# Patient Record
Sex: Female | Born: 1935 | Race: White | Hispanic: No | Smoking: Never smoker
Health system: Southern US, Community
[De-identification: ages and names within clinical notes are randomized; demographics above are authoritative.]

## PROBLEM LIST (undated history)

## (undated) DIAGNOSIS — K311 Adult hypertrophic pyloric stenosis: Secondary | ICD-10-CM

## (undated) DIAGNOSIS — R1310 Dysphagia, unspecified: Secondary | ICD-10-CM

## (undated) DIAGNOSIS — I4891 Unspecified atrial fibrillation: Secondary | ICD-10-CM

## (undated) DIAGNOSIS — K623 Rectal prolapse: Secondary | ICD-10-CM

## (undated) DIAGNOSIS — I5030 Unspecified diastolic (congestive) heart failure: Secondary | ICD-10-CM

## (undated) DIAGNOSIS — R1319 Other dysphagia: Secondary | ICD-10-CM

## (undated) DIAGNOSIS — M1990 Unspecified osteoarthritis, unspecified site: Secondary | ICD-10-CM

## (undated) DIAGNOSIS — I10 Essential (primary) hypertension: Secondary | ICD-10-CM

## (undated) HISTORY — DX: Unspecified atrial fibrillation: I48.91

## (undated) HISTORY — PX: TOTAL HIP ARTHROPLASTY: SHX124

## (undated) HISTORY — PX: ARTHROSCOPIC REPAIR PCL: SUR81

## (undated) HISTORY — DX: Adult hypertrophic pyloric stenosis: K31.1

## (undated) HISTORY — DX: Essential (primary) hypertension: I10

## (undated) HISTORY — DX: Unspecified osteoarthritis, unspecified site: M19.90

## (undated) HISTORY — DX: Rectal prolapse: K62.3

## (undated) HISTORY — DX: Other dysphagia: R13.19

## (undated) HISTORY — DX: Unspecified diastolic (congestive) heart failure: I50.30

## (undated) HISTORY — DX: Dysphagia, unspecified: R13.10

---

## 1997-12-23 ENCOUNTER — Other Ambulatory Visit: Admission: RE | Admit: 1997-12-23 | Discharge: 1997-12-23 | Payer: Self-pay | Admitting: Obstetrics and Gynecology

## 1998-01-12 ENCOUNTER — Inpatient Hospital Stay (HOSPITAL_COMMUNITY): Admission: RE | Admit: 1998-01-12 | Discharge: 1998-01-14 | Payer: Self-pay | Admitting: Obstetrics and Gynecology

## 2000-02-09 ENCOUNTER — Other Ambulatory Visit: Admission: RE | Admit: 2000-02-09 | Discharge: 2000-02-09 | Payer: Self-pay | Admitting: Obstetrics and Gynecology

## 2000-09-24 ENCOUNTER — Encounter: Admission: RE | Admit: 2000-09-24 | Discharge: 2000-09-24 | Payer: Self-pay | Admitting: Internal Medicine

## 2000-09-24 ENCOUNTER — Encounter: Payer: Self-pay | Admitting: Internal Medicine

## 2009-05-22 HISTORY — PX: OTHER SURGICAL HISTORY: SHX169

## 2009-05-25 ENCOUNTER — Encounter (INDEPENDENT_AMBULATORY_CARE_PROVIDER_SITE_OTHER): Payer: Self-pay | Admitting: *Deleted

## 2009-06-07 ENCOUNTER — Encounter: Payer: Self-pay | Admitting: Internal Medicine

## 2009-06-07 ENCOUNTER — Encounter (INDEPENDENT_AMBULATORY_CARE_PROVIDER_SITE_OTHER): Payer: Self-pay | Admitting: *Deleted

## 2009-06-07 ENCOUNTER — Inpatient Hospital Stay (HOSPITAL_COMMUNITY): Admission: AD | Admit: 2009-06-07 | Discharge: 2009-06-13 | Payer: Self-pay | Admitting: Internal Medicine

## 2009-06-07 ENCOUNTER — Ambulatory Visit: Payer: Self-pay | Admitting: Cardiology

## 2009-06-07 ENCOUNTER — Ambulatory Visit: Payer: Self-pay | Admitting: Family

## 2009-06-07 DIAGNOSIS — I4891 Unspecified atrial fibrillation: Secondary | ICD-10-CM

## 2009-06-07 DIAGNOSIS — I509 Heart failure, unspecified: Secondary | ICD-10-CM | POA: Insufficient documentation

## 2009-06-07 DIAGNOSIS — K623 Rectal prolapse: Secondary | ICD-10-CM | POA: Insufficient documentation

## 2009-06-13 ENCOUNTER — Encounter (INDEPENDENT_AMBULATORY_CARE_PROVIDER_SITE_OTHER): Payer: Self-pay | Admitting: *Deleted

## 2009-06-15 ENCOUNTER — Encounter: Payer: Self-pay | Admitting: Gastroenterology

## 2009-06-16 ENCOUNTER — Ambulatory Visit: Payer: Self-pay | Admitting: Internal Medicine

## 2009-06-16 ENCOUNTER — Ambulatory Visit: Payer: Self-pay | Admitting: Cardiology

## 2009-06-16 ENCOUNTER — Telehealth (INDEPENDENT_AMBULATORY_CARE_PROVIDER_SITE_OTHER): Payer: Self-pay | Admitting: *Deleted

## 2009-06-17 LAB — CONVERTED CEMR LAB
BUN: 40 mg/dL — ABNORMAL HIGH (ref 6–23)
CO2: 29 meq/L (ref 19–32)
Calcium: 9.9 mg/dL (ref 8.4–10.5)
GFR calc non Af Amer: 49.93 mL/min (ref >60)
Glucose, Bld: 91 mg/dL (ref 70–99)
Potassium: 5.5 meq/L — ABNORMAL HIGH (ref 3.5–5.1)

## 2009-06-21 ENCOUNTER — Telehealth (INDEPENDENT_AMBULATORY_CARE_PROVIDER_SITE_OTHER): Payer: Self-pay | Admitting: *Deleted

## 2009-06-22 ENCOUNTER — Ambulatory Visit: Payer: Self-pay | Admitting: Internal Medicine

## 2009-06-22 ENCOUNTER — Ambulatory Visit: Payer: Self-pay | Admitting: Family

## 2009-06-22 ENCOUNTER — Telehealth (INDEPENDENT_AMBULATORY_CARE_PROVIDER_SITE_OTHER): Payer: Self-pay | Admitting: *Deleted

## 2009-06-22 LAB — CONVERTED CEMR LAB: POC INR: 3.1

## 2009-06-23 ENCOUNTER — Encounter (INDEPENDENT_AMBULATORY_CARE_PROVIDER_SITE_OTHER): Payer: Self-pay | Admitting: *Deleted

## 2009-06-23 LAB — CONVERTED CEMR LAB
BUN: 47 mg/dL — ABNORMAL HIGH (ref 6–23)
Glucose, Bld: 98 mg/dL (ref 70–99)
Potassium: 4.4 meq/L (ref 3.5–5.1)
Sodium: 141 meq/L (ref 135–145)

## 2009-06-25 ENCOUNTER — Encounter: Payer: Self-pay | Admitting: Internal Medicine

## 2009-06-25 ENCOUNTER — Ambulatory Visit: Payer: Self-pay | Admitting: Internal Medicine

## 2009-06-28 ENCOUNTER — Encounter: Payer: Self-pay | Admitting: Internal Medicine

## 2009-06-29 ENCOUNTER — Telehealth: Payer: Self-pay | Admitting: Internal Medicine

## 2009-06-29 ENCOUNTER — Encounter: Payer: Self-pay | Admitting: Cardiology

## 2009-06-29 ENCOUNTER — Encounter: Payer: Self-pay | Admitting: Family

## 2009-06-29 LAB — CONVERTED CEMR LAB: Prothrombin Time: 32.4 s

## 2009-07-01 ENCOUNTER — Telehealth: Payer: Self-pay | Admitting: Gastroenterology

## 2009-07-05 ENCOUNTER — Ambulatory Visit: Payer: Self-pay | Admitting: Gastroenterology

## 2009-07-05 DIAGNOSIS — D68318 Other hemorrhagic disorder due to intrinsic circulating anticoagulants, antibodies, or inhibitors: Secondary | ICD-10-CM | POA: Insufficient documentation

## 2009-07-06 ENCOUNTER — Ambulatory Visit: Payer: Self-pay | Admitting: Gastroenterology

## 2009-07-06 ENCOUNTER — Encounter: Payer: Self-pay | Admitting: Internal Medicine

## 2009-07-06 LAB — CONVERTED CEMR LAB
POC INR: 2.9
Prothrombin Time: 29 s

## 2009-07-09 ENCOUNTER — Ambulatory Visit (HOSPITAL_COMMUNITY): Admission: RE | Admit: 2009-07-09 | Discharge: 2009-07-09 | Payer: Self-pay | Admitting: Gastroenterology

## 2009-07-12 ENCOUNTER — Encounter: Payer: Self-pay | Admitting: Gastroenterology

## 2009-07-13 ENCOUNTER — Encounter: Payer: Self-pay | Admitting: Cardiology

## 2009-07-13 LAB — CONVERTED CEMR LAB
POC INR: 2.9
Prothrombin Time: 28.6 s

## 2009-07-14 ENCOUNTER — Encounter: Payer: Self-pay | Admitting: Family

## 2009-07-20 ENCOUNTER — Telehealth (INDEPENDENT_AMBULATORY_CARE_PROVIDER_SITE_OTHER): Payer: Self-pay | Admitting: Cardiology

## 2009-07-20 ENCOUNTER — Telehealth (INDEPENDENT_AMBULATORY_CARE_PROVIDER_SITE_OTHER): Payer: Self-pay | Admitting: *Deleted

## 2009-07-20 ENCOUNTER — Encounter: Payer: Self-pay | Admitting: *Deleted

## 2009-07-20 ENCOUNTER — Inpatient Hospital Stay (HOSPITAL_COMMUNITY): Admission: EM | Admit: 2009-07-20 | Discharge: 2009-07-22 | Payer: Self-pay | Admitting: Emergency Medicine

## 2009-07-20 LAB — CONVERTED CEMR LAB: Prothrombin Time: 36.1 s

## 2009-07-23 ENCOUNTER — Telehealth (INDEPENDENT_AMBULATORY_CARE_PROVIDER_SITE_OTHER): Payer: Self-pay | Admitting: *Deleted

## 2009-07-26 ENCOUNTER — Encounter: Payer: Self-pay | Admitting: Internal Medicine

## 2009-07-26 ENCOUNTER — Telehealth (INDEPENDENT_AMBULATORY_CARE_PROVIDER_SITE_OTHER): Payer: Self-pay | Admitting: *Deleted

## 2009-07-27 ENCOUNTER — Telehealth: Payer: Self-pay | Admitting: Family

## 2009-07-30 ENCOUNTER — Encounter: Payer: Self-pay | Admitting: Cardiology

## 2009-07-30 LAB — CONVERTED CEMR LAB
POC INR: 1.6
Prothrombin Time: 15.8 s

## 2009-08-05 ENCOUNTER — Encounter: Payer: Self-pay | Admitting: Cardiology

## 2009-08-05 ENCOUNTER — Encounter: Payer: Self-pay | Admitting: Family

## 2009-08-06 ENCOUNTER — Telehealth: Payer: Self-pay | Admitting: Family

## 2009-08-10 ENCOUNTER — Encounter: Payer: Self-pay | Admitting: Internal Medicine

## 2009-08-10 ENCOUNTER — Encounter: Payer: Self-pay | Admitting: Family

## 2009-08-12 ENCOUNTER — Encounter: Payer: Self-pay | Admitting: Cardiology

## 2009-08-12 LAB — CONVERTED CEMR LAB: POC INR: 2.9

## 2009-08-14 ENCOUNTER — Encounter: Payer: Self-pay | Admitting: Family

## 2009-08-19 ENCOUNTER — Encounter: Payer: Self-pay | Admitting: Internal Medicine

## 2009-08-20 ENCOUNTER — Encounter: Payer: Self-pay | Admitting: Internal Medicine

## 2009-08-20 LAB — CONVERTED CEMR LAB: Prothrombin Time: 17 s

## 2009-08-23 ENCOUNTER — Encounter: Payer: Self-pay | Admitting: Family

## 2009-08-25 ENCOUNTER — Encounter: Payer: Self-pay | Admitting: Gastroenterology

## 2009-08-25 ENCOUNTER — Encounter: Payer: Self-pay | Admitting: Internal Medicine

## 2009-08-27 ENCOUNTER — Encounter: Payer: Self-pay | Admitting: Cardiology

## 2009-09-01 ENCOUNTER — Ambulatory Visit (HOSPITAL_COMMUNITY): Admission: RE | Admit: 2009-09-01 | Discharge: 2009-09-01 | Payer: Self-pay | Admitting: Surgery

## 2009-09-02 ENCOUNTER — Ambulatory Visit: Payer: Self-pay | Admitting: Internal Medicine

## 2009-09-02 ENCOUNTER — Encounter: Payer: Self-pay | Admitting: Internal Medicine

## 2009-09-03 ENCOUNTER — Encounter: Payer: Self-pay | Admitting: Internal Medicine

## 2009-09-03 ENCOUNTER — Emergency Department (HOSPITAL_COMMUNITY): Admission: EM | Admit: 2009-09-03 | Discharge: 2009-09-03 | Payer: Self-pay | Admitting: Emergency Medicine

## 2009-09-03 ENCOUNTER — Telehealth: Payer: Self-pay | Admitting: Internal Medicine

## 2009-09-03 LAB — CONVERTED CEMR LAB: POC INR: 5.7

## 2009-09-13 ENCOUNTER — Encounter: Payer: Self-pay | Admitting: Cardiovascular Disease

## 2009-09-13 ENCOUNTER — Telehealth (INDEPENDENT_AMBULATORY_CARE_PROVIDER_SITE_OTHER): Payer: Self-pay

## 2009-09-14 ENCOUNTER — Ambulatory Visit: Payer: Self-pay

## 2009-09-14 ENCOUNTER — Encounter (HOSPITAL_COMMUNITY): Admission: RE | Admit: 2009-09-14 | Discharge: 2009-11-23 | Payer: Self-pay | Admitting: Internal Medicine

## 2009-09-14 ENCOUNTER — Ambulatory Visit: Payer: Self-pay | Admitting: Cardiology

## 2009-09-14 ENCOUNTER — Encounter (INDEPENDENT_AMBULATORY_CARE_PROVIDER_SITE_OTHER): Payer: Self-pay | Admitting: *Deleted

## 2009-09-15 ENCOUNTER — Telehealth: Payer: Self-pay | Admitting: Family

## 2009-09-15 ENCOUNTER — Encounter: Payer: Self-pay | Admitting: Family

## 2009-09-16 ENCOUNTER — Encounter: Payer: Self-pay | Admitting: Internal Medicine

## 2009-09-17 ENCOUNTER — Encounter: Payer: Self-pay | Admitting: Internal Medicine

## 2009-09-20 ENCOUNTER — Encounter: Payer: Self-pay | Admitting: Cardiology

## 2009-09-20 LAB — CONVERTED CEMR LAB
POC INR: 2
Prothrombin Time: 19.6 s

## 2009-09-21 ENCOUNTER — Encounter: Payer: Self-pay | Admitting: Internal Medicine

## 2009-09-22 ENCOUNTER — Encounter: Payer: Self-pay | Admitting: Family

## 2009-09-24 ENCOUNTER — Telehealth: Payer: Self-pay | Admitting: Internal Medicine

## 2009-09-28 ENCOUNTER — Encounter: Payer: Self-pay | Admitting: Cardiovascular Disease

## 2009-09-28 LAB — CONVERTED CEMR LAB
POC INR: 2.1
Prothrombin Time: 20.5 s

## 2009-10-02 ENCOUNTER — Inpatient Hospital Stay (HOSPITAL_COMMUNITY): Admission: EM | Admit: 2009-10-02 | Discharge: 2009-10-07 | Payer: Self-pay | Admitting: Emergency Medicine

## 2009-10-04 ENCOUNTER — Ambulatory Visit: Payer: Self-pay | Admitting: Vascular Surgery

## 2009-10-04 ENCOUNTER — Encounter (INDEPENDENT_AMBULATORY_CARE_PROVIDER_SITE_OTHER): Payer: Self-pay | Admitting: Internal Medicine

## 2009-10-06 ENCOUNTER — Encounter (INDEPENDENT_AMBULATORY_CARE_PROVIDER_SITE_OTHER): Payer: Self-pay | Admitting: Internal Medicine

## 2009-10-18 ENCOUNTER — Encounter: Payer: Self-pay | Admitting: Family

## 2009-11-19 ENCOUNTER — Encounter (INDEPENDENT_AMBULATORY_CARE_PROVIDER_SITE_OTHER): Payer: Self-pay | Admitting: Surgery

## 2009-11-19 ENCOUNTER — Inpatient Hospital Stay (HOSPITAL_COMMUNITY): Admission: RE | Admit: 2009-11-19 | Discharge: 2009-11-24 | Payer: Self-pay | Admitting: Surgery

## 2009-11-26 ENCOUNTER — Encounter: Payer: Self-pay | Admitting: Internal Medicine

## 2009-11-30 ENCOUNTER — Telehealth: Payer: Self-pay | Admitting: Internal Medicine

## 2009-12-02 ENCOUNTER — Encounter: Payer: Self-pay | Admitting: Internal Medicine

## 2009-12-02 LAB — CONVERTED CEMR LAB: Prothrombin Time: 12.7 s

## 2009-12-08 ENCOUNTER — Encounter: Payer: Self-pay | Admitting: Cardiology

## 2009-12-15 ENCOUNTER — Encounter: Payer: Self-pay | Admitting: Cardiology

## 2009-12-15 ENCOUNTER — Encounter: Payer: Self-pay | Admitting: Internal Medicine

## 2009-12-15 ENCOUNTER — Telehealth: Payer: Self-pay | Admitting: Internal Medicine

## 2009-12-15 LAB — CONVERTED CEMR LAB: POC INR: 2.1

## 2009-12-22 ENCOUNTER — Encounter: Payer: Self-pay | Admitting: Cardiology

## 2009-12-22 LAB — CONVERTED CEMR LAB: Prothrombin Time: 20.6 s

## 2010-01-05 ENCOUNTER — Encounter: Payer: Self-pay | Admitting: Cardiovascular Disease

## 2010-01-05 LAB — CONVERTED CEMR LAB: Prothrombin Time: 24.6 s

## 2010-01-12 ENCOUNTER — Encounter: Payer: Self-pay | Admitting: Gastroenterology

## 2010-01-24 ENCOUNTER — Encounter: Payer: Self-pay | Admitting: Internal Medicine

## 2010-01-31 ENCOUNTER — Telehealth: Payer: Self-pay | Admitting: Family

## 2010-01-31 ENCOUNTER — Ambulatory Visit: Payer: Self-pay | Admitting: Internal Medicine

## 2010-02-02 ENCOUNTER — Encounter: Payer: Self-pay | Admitting: Cardiology

## 2010-02-02 LAB — CONVERTED CEMR LAB
POC INR: 1.5
Prothrombin Time: 14.5 s

## 2010-02-09 ENCOUNTER — Encounter: Payer: Self-pay | Admitting: Cardiology

## 2010-02-09 LAB — CONVERTED CEMR LAB
POC INR: 1.8
Prothrombin Time: 17.7 s

## 2010-02-16 ENCOUNTER — Encounter: Payer: Self-pay | Admitting: Internal Medicine

## 2010-02-24 ENCOUNTER — Encounter: Payer: Self-pay | Admitting: Internal Medicine

## 2010-02-24 LAB — CONVERTED CEMR LAB: POC INR: 2.2

## 2010-02-28 ENCOUNTER — Encounter: Payer: Self-pay | Admitting: Internal Medicine

## 2010-03-03 ENCOUNTER — Encounter: Payer: Self-pay | Admitting: Internal Medicine

## 2010-03-09 ENCOUNTER — Encounter: Payer: Self-pay | Admitting: Internal Medicine

## 2010-03-09 LAB — CONVERTED CEMR LAB: Prothrombin Time: 21.3 s

## 2010-04-06 ENCOUNTER — Ambulatory Visit: Payer: Self-pay | Admitting: Cardiology

## 2010-04-20 ENCOUNTER — Ambulatory Visit: Payer: Self-pay | Admitting: Internal Medicine

## 2010-04-20 LAB — CONVERTED CEMR LAB: POC INR: 1.6

## 2010-05-09 ENCOUNTER — Ambulatory Visit: Payer: Self-pay | Admitting: Internal Medicine

## 2010-05-09 LAB — CONVERTED CEMR LAB: POC INR: 1.8

## 2010-05-30 ENCOUNTER — Ambulatory Visit: Admission: RE | Admit: 2010-05-30 | Discharge: 2010-05-30 | Payer: Self-pay | Source: Home / Self Care

## 2010-06-19 LAB — CONVERTED CEMR LAB
BUN: 61 mg/dL — ABNORMAL HIGH (ref 6–23)
Basophils Absolute: 0 10*3/uL (ref 0.0–0.1)
Basophils Relative: 0.4 % (ref 0.0–3.0)
CO2: 23 meq/L (ref 19–32)
CO2: 28 meq/L (ref 19–32)
Calcium: 9.4 mg/dL (ref 8.4–10.5)
Chloride: 107 meq/L (ref 96–112)
Creatinine, Ser: 1.2 mg/dL (ref 0.4–1.2)
Creatinine, Ser: 1.8 mg/dL — ABNORMAL HIGH (ref 0.4–1.2)
Eosinophils Absolute: 0.2 10*3/uL (ref 0.0–0.7)
GFR calc non Af Amer: 28.27 mL/min (ref >60)
GFR calc non Af Amer: 45.15 mL/min (ref >60)
Hemoglobin: 12.3 g/dL (ref 12.0–15.0)
Lymphocytes Relative: 14.6 % (ref 12.0–46.0)
Lymphs Abs: 1.1 10*3/uL (ref 0.7–4.0)
MCV: 93.3 fL (ref 78.0–100.0)
Monocytes Relative: 9.6 % (ref 3.0–12.0)
Neutro Abs: 5.5 10*3/uL (ref 1.4–7.7)
Neutrophils Relative %: 72.2 % (ref 43.0–77.0)
Pap Smear: NORMAL
Potassium: 5 meq/L (ref 3.5–5.1)
Pro B Natriuretic peptide (BNP): 677 pg/mL — ABNORMAL HIGH (ref 0.0–100.0)

## 2010-06-20 ENCOUNTER — Ambulatory Visit: Admission: RE | Admit: 2010-06-20 | Discharge: 2010-06-20 | Payer: Self-pay | Source: Home / Self Care

## 2010-06-20 LAB — CONVERTED CEMR LAB
INR: 1.8
POC INR: 1.8

## 2010-06-21 NOTE — Letter (Signed)
Summary: Results Letter  [facility]  [address] [place], [place] [address]   Phone: [phone]  Fax: [phone]        July 12, 2009 MRN: [id number]    Lindsey Ferguson [address] [place], [place]  [address]    Dear [name],  Your ultrasound results did not show any remarkable findings.  Please continue with the recommendations previously discussed.  Should you have any further questions or immediate concers, feel free to contact me.  Sincerely,  Barbette Hair. Arlyce Dice, M.D., [facility]          Sincerely,  Louis Meckel MD  This letter has been electronically signed by your physician.  Appended Document: Results Letter Letter mailed to patient.

## 2010-06-21 NOTE — Letter (Signed)
Summary: New Patient Letter  [facility]  [address] [place], [place] [address]   Phone: [phone]  Fax: [phone]       05/25/2009 MRN: [id number]  Lindsey Ferguson [address] [place], [place]  [address]  Dear [name],   Welcome to [facility] and thank you for choosing [place] as your Primary Care Providers. Enclosed you will find information about our practice that we hope you find helpful. We have also enclosed forms to be filled out prior to your visit. This will provide [place] with the necessary information and facilitate your being seen in a timely manner. If you have any questions, please call us at:    [phone]      and we will be happy to assist you. We look forward to seeing you at your scheduled appointment time.  Appointment   JANUARY [phone] @ 9:00AM            with .  MELISSA O'SULLIVAN               Sincerely,  Primary Health Care Team  Please arrive 15 minutes early for your first appointment and bring your insurance card. Co-pay is required at the time of your visit.  *****Please call the office if you are not able to keep this appointment. There is a charge of $50.00 if any appointment is not cancelled or rescheduled within 24 hours.

## 2010-06-21 NOTE — Progress Notes (Signed)
Summary: stop coumadin  Phone Note From Other Clinic   Caller: nurse Elease Hashimoto Summary of Call: Per Elease Hashimoto pt needs to stop coumadin 5 days prior to procedure is she ok to do this?? ofc [phone] fax [phone]. needs to know ASAP Initial call taken by: Edman Circle,  Sep 24, 2009 4:49 PM  Follow-up for Phone Call        No history of stroke.  OK to stop coumadin prior to surgery. Follow-up by: Sherrill Raring, MD, [facility],  Sep 24, 2009 6:13 PM  Additional Follow-up for Phone Call Additional follow up Details #1::        Faxed to above number. Additional Follow-up by: J REISS RN     Appended Document: stop coumadin LM for Alisha to make sure she recieved  fax concerning  Coumadin issue.  Appended Document: stop coumadin Alisha called back..she did receive the fax I sent.

## 2010-06-21 NOTE — Medication Information (Signed)
Summary: Coumadin Clinic  Anticoagulant Therapy  Managed by: Cloyde Reams, RN, BSN Referring MD: Dietrich Pates, MD PCP: Tonie Griffith, MD Supervising MD: Graciela Husbands MD, Viviann Spare Indication 1: Atrial Fibrillation Lab Used: [facility] [place] Site: [address] PT 29.0 INR POC 2.9 INR RANGE 2-3    Bleeding/hemorrhagic complications: no     Any changes in medication regimen? no     Any missed doses?: no         Allergies: No Known Drug Allergies  Anticoagulation Management History:      Her anticoagulation is being managed by telephone today.  Positive risk factors for bleeding include an age of 75 years or older.  The bleeding index is 'intermediate risk'.  Positive CHADS2 values include History of CHF and Age > 75 years old.  Prothrombin time is 29.0.  Anticoagulation responsible provider: Graciela Husbands MD, Viviann Spare.  INR POC: 2.9.  Exp: 08/2010.    Anticoagulation Management Assessment/Plan:      The patient's current anticoagulation dose is Warfarin sodium 2.5 mg tabs: Use as directed by Anticoagualtion Clinic.  The target INR is 2.0-3.0.  The next INR is due 07/13/2009.  Anticoagulation instructions were given to [place] RN.  Results were reviewed/authorized by Cloyde Reams, RN, BSN.  She was notified by Cloyde Reams RN.         Prior Anticoagulation Instructions: INR 3.2  Called spoke with pt.  Advised to take 1/2 tablet tomorrow then start taking 1 tablet daily except 2 tablets on Mondays.  Recheck in 1 week.  Called spoke with [place], RN [facility] advised of dosage instructions and gave verbal order to recheck PT/INR in 1 week on 07/06/09.  Current Anticoagulation Instructions: INR 2.9  Spoke with [place], Forde Radon [facility] RN while at pt's home.  Advised to continue on same dosage 1 tablet daily except 2 tablets on Mondays.  Recheck in 1 week.

## 2010-06-21 NOTE — Medication Information (Signed)
Summary: Coumadin Clinic  Anticoagulant Therapy  Managed by: Bethena Midget, RN, BSN Referring MD: Dietrich Pates, MD PCP: Tonie Griffith, MD Supervising MD: Riley Kill MD, Maisie Fus Indication 1: Atrial Fibrillation Lab Used: [facility] [place] Site: [address] PT 20.6 INR POC 2.1 INR RANGE 2-3  Dietary changes: no    Health status changes: no    Bleeding/hemorrhagic complications: no    Recent/future hospitalizations: no    Any changes in medication regimen? no    Recent/future dental: no  Any missed doses?: no       Is patient compliant with meds? yes      Comments: Per Delice Bison with [facility], there has not been any changes with pt .  Allergies: No Known Drug Allergies  Anticoagulation Management History:      Her anticoagulation is being managed by telephone today.  Positive risk factors for bleeding include an age of 65 years or older and presence of serious comorbidities.  The bleeding index is 'intermediate risk'.  Positive CHADS2 values include History of CHF and Age > 40 years old.  Prothrombin time is 20.6.  Anticoagulation responsible provider: Riley Kill MD, Maisie Fus.  INR POC: 2.1.    Anticoagulation Management Assessment/Plan:      The patient's current anticoagulation dose is Warfarin sodium 2.5 mg tabs: Use as directed by Anticoagualtion Clinic.  The target INR is 2.0-3.0.  The next INR is due 01/05/2010.  Anticoagulation instructions were given to home health nurse/Tara [facility].  Results were reviewed/authorized by Bethena Midget, RN, BSN.  She was notified by Bethena Midget, RN, BSN.         Prior Anticoagulation Instructions: INR 2.1 Continue 2.5mg s daily except 3.75mg s on Thursdays.  Recheck in one week. Pt aware and [facility] nurse.   Current Anticoagulation Instructions: INR 2.1 Continue 2.5mg s daily except 3.75mg s on Thursdays. Recheck in 2 weeks. Orders given to Delice Bison with [facility].

## 2010-06-21 NOTE — Assessment & Plan Note (Signed)
Summary: RECTAL PROLAPSE--CH   History of Present Illness Visit Type: Initial Consult Primary GI MD: Melvia Heaps MD [facility] Primary Provider: Tonie Griffith, MD Requesting Provider: Loreen Freud, DO Chief Complaint: Rectal prolapse History of Present Illness:   Lindsey Ferguson this 77 year old white female referred at the request of Dr. Laury Axon and  Dr. Zachery Dakins for evaluation of a possible rectal prolapse.  Over the last 7 months she had been complaining of rectal pain with bleeding.  She presented to the primary care office with these complaints but it was noted that she had atrial fibrillation and was hospitalized for this. The patient notes protrusion of her rectum with straining.  When examined by surgery there was a massive amount of stool in the rectum.  A rectocele was appreciated in addition to a prolapsed vagina.  She has a history of a hysterectomy and bladder tack.  She was placed on MiraLax.   Since that time she has  had no further bleeding or pain.  She does have days of very loose stools.  The patient was placed on Coumadin because of her atrial fibrillation.  She has a history of congestive heart failure.   GI Review of Systems    Reports weight loss.   Weight loss of 15 pounds over 1 month.   Denies abdominal pain, acid reflux, belching, bloating, chest pain, dysphagia with liquids, dysphagia with solids, heartburn, loss of appetite, nausea, vomiting, vomiting blood, and  weight gain.      Reports rectal bleeding and  rectal pain.     Denies anal fissure, black tarry stools, change in bowel habit, constipation, diarrhea, diverticulosis, fecal incontinence, heme positive stool, hemorrhoids, irritable bowel syndrome, jaundice, light color stool, and  liver problems.    Current Medications (verified): 1)  Carvedilol 6.25 Mg Tabs (Carvedilol) .... Take One Tablet By Mouth Twice A Day 2)  Lisinopril 10 Mg Tabs (Lisinopril) .... Take One Tablet By Mouth Daily 3)  Furosemide 40 Mg  Tabs (Furosemide) .... Take One Tablet By Mouth Daily. 4)  Warfarin Sodium 2.5 Mg Tabs (Warfarin Sodium) .... Use As Directed By Anticoagualtion Clinic 5)  Multivitamins  Tabs (Multiple Vitamin) 6)  Vitamin D 1000 Unit Tabs (Cholecalciferol) 7)  Klor-Con M20 20 Meq Cr-Tabs (Potassium Chloride Crys Cr) .... Take One Tablet Daily 8)  Acetaminphen .... As Needed  Allergies (verified): No Known Drug Allergies  Past History:  Past Medical History: Reviewed history from 06/25/2009 and no changes required. Arthritis Chicken Pox Atrial fibrillation CHF Rectal prolapse  Past Surgical History: Hip Replacement-1988 Bladder Tack Arthroscopic surgery x2  Family History: Reviewed history from 06/07/2009 and no changes required. CAD-no HTN-no DM-no COLON CA-no BREAST CA-no STROKE-father-subarachnoid hemorrhage BRAIN CA-mother and brother  Social History: Reviewed history from 06/07/2009 and no changes required. 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 never smoked no ETOH no drugs  Review of Systems  The patient denies allergy/sinus, anemia, anxiety-new, arthritis/joint pain, back pain, blood in urine, breast changes/lumps, change in vision, confusion, cough, coughing up blood, depression-new, fainting, fatigue, fever, headaches-new, hearing problems, heart murmur, heart rhythm changes, itching, menstrual pain, muscle pains/cramps, night sweats, nosebleeds, pregnancy symptoms, shortness of breath, skin rash, sleeping problems, sore throat, swelling of feet/legs, swollen lymph glands, thirst - excessive , urination - excessive , urination changes/pain, urine leakage, vision changes, and voice change.    Vital Signs:  Patient profile:   77 year old female Height:      61 inches Weight:      94.38  pounds Pulse rate:   56 / minute Pulse rhythm:   irregular BP sitting:   112 / 76  (left arm) Cuff size:   regular  Vitals Entered By: June McMurray CMA Duncan Dull) (July 05, 2009  4:36 PM)  Physical Exam  Additional Exam:  On physical exam she is an elderly female  skin: anicter  HEENT: normocephalic; PEERLA; no nasal or orpharyngeal abnormalities neck: supple nodes: no cervical adenopathy chest: clear cor:  no murmurs, gallops or rubs abd:  bowel sounds normoactive; no abdominal masses, tenderness, organomegaly; the left lobe of the liver is palpable just under the xiphoid process. rectal: no masses; stool heme negative; there was no rectal prolapse with a Valsalva maneuver; a vaginal prolapse l is present ext: no cyanosis, clubbing, or edema skeletal: no gross skeletal abnormalities neuro: alert, oriented x 3; no focal abnormalities    Impression & Recommendations:  Problem # 1:  RECTAL PROLAPSE (ICD-569.1) By history the patient has a rectal prolapse.  Her symptoms probably were related to a stool impaction causing severe straining and subsequent prolapse and bleeding.  A structural abnormality of the distal colon should be ruled out.  Recommendations #1 flexible sigmoidoscopy #2 regimen of stool softeners and mild cathartics to keep her bowels going.  With minimal straining hopefully she will have minimal to no prolapse and consequently will not require a repair  Problem # 2:  R/O HEPATOMEGALY (ICD-789.1) There was questionable hepatomegaly or abdominal mass by exam  Recommendations #1 abdominal ultrasound  Problem # 3:  COAGULOPATHY, COUMADIN-INDUCED (ICD-286.5) Assessment: Comment Only  Other Orders: Flex with Sedation (Flex w/Sed) Ultrasound Abdomen (UAS)

## 2010-06-21 NOTE — Miscellaneous (Signed)
Summary: SN Order/[facility]  SN Order/[facility]   Imported By: Lanelle Bal 09/21/2009 14:15:28  _____________________________________________________________________  External Attachment:    Type:   Image     Comment:   External Document

## 2010-06-21 NOTE — Medication Information (Signed)
Summary: Coumadin Clinic  Anticoagulant Therapy  Managed by: Bethena Midget, RN, BSN Referring MD: Dietrich Pates, MD PCP: Tonie Griffith, MD Supervising MD: Eden Emms MD, Theron Arista Indication 1: Atrial Fibrillation Lab Used: [facility] [place] Site: [address] PT 24.6 INR POC 2.5 INR RANGE 2-3  Dietary changes: no    Health status changes: no    Bleeding/hemorrhagic complications: no    Recent/future hospitalizations: no    Any changes in medication regimen? no    Recent/future dental: no  Any missed doses?: no       Is patient compliant with meds? yes      Comments: Per [employer] nurse  Allergies: No Known Drug Allergies  Anticoagulation Management History:      Her anticoagulation is being managed by telephone today.  Positive risk factors for bleeding include an age of 44 years or older and presence of serious comorbidities.  The bleeding index is 'intermediate risk'.  Positive CHADS2 values include History of CHF and Age > 76 years old.  Prothrombin time is 24.6.  Anticoagulation responsible provider: Eden Emms MD, Theron Arista.  INR POC: 2.5.    Anticoagulation Management Assessment/Plan:      The patient's current anticoagulation dose is Warfarin sodium 2.5 mg tabs: Use as directed by Anticoagualtion Clinic.  The target INR is 2.0-3.0.  The next INR is due 02/02/2010.  Anticoagulation instructions were given to home health nurse/Tara [facility].  Results were reviewed/authorized by Bethena Midget, RN, BSN.  She was notified by Bethena Midget, RN, BSN.         Prior Anticoagulation Instructions: INR 2.1 Continue 2.5mg s daily except 3.75mg s on Thursdays. Recheck in 2 weeks. Orders given to Delice Bison with [facility].   Current Anticoagulation Instructions: INR 2.5 Continue 2.5mg s daily except 3.75mg  on Thursdays. REcheck in 4 weeks. Orders given to [facility] nurse while at home with pt.

## 2010-06-21 NOTE — Miscellaneous (Signed)
Summary: SN Orders/[facility]  SN Orders/[facility]   Imported By: Lanelle Bal 08/27/2009 09:04:07  _____________________________________________________________________  External Attachment:    Type:   Image     Comment:   External Document

## 2010-06-21 NOTE — Progress Notes (Signed)
Summary: f/u appt-lmom  Phone Note Outgoing Call   Summary of Call: Could you please make sure Lindsey Ferguson has a follow up appointment scheduled.  Thanks Initial call taken by: Lemont Fillers FNP,  June 16, 2009 12:14 PM  Follow-up for Phone Call        patient has several appts for the next week just needs f/u w/ Melissa once she is done w/ cardio appts. left message on machine .Marland KitchenDoristine Devoid  June 16, 2009 4:33 PM  Nephew Tasia Catchings called  was seen was in hosp after ov 06/07/09 - 06/13/09, had appt 06/16/09 with cardio. saw Dr Zachery Dakins. Want to discuss  OV scheduled  Follow-up by: Kandice Hams,  June 21, 2009 12:11 PM

## 2010-06-21 NOTE — Letter (Signed)
Summary: New Patient letter  [facility]  [address] [place], [place] [address]   Phone: [phone]  Fax: [phone]       06/23/2009 MRN: [id number]  Lindsey Ferguson [address] [place], [place]  [address]  Dear Ms. Topper,  Welcome to the Gastroenterology Division at [facility].    You are scheduled to see Dr. Arlyce Dice on 07-15-09 at 10:00a.m. on the 3rd floor at [facility], [address]  We ask that you try to arrive at our office 15 minutes prior to your appointment time to allow for check-in.  We would like you to complete the enclosed self-administered evaluation form prior to your visit and bring it with you on the day of your appointment.  We will review it with you.  Also, please bring a complete list of all your medications or, if you prefer, bring the medication bottles and we will list them.  Please bring your insurance card so that we may make a copy of it.  If your insurance requires a referral to see a specialist, please bring your referral form from your primary care physician.  Co-payments are due at the time of your visit and may be paid by cash, check or credit card.     Your office visit will consist of a consult with your physician (includes a physical exam), any laboratory testing he/she may order, scheduling of any necessary diagnostic testing (e.g. x-ray, ultrasound, CT-scan), and scheduling of a procedure (e.g. Endoscopy, Colonoscopy) if required.  Please allow enough time on your schedule to allow for any/all of these possibilities.    If you cannot keep your appointment, please call [phone] to cancel or reschedule prior to your appointment date.  This allows [place] the opportunity to schedule an appointment for another patient in need of care.  If you do not cancel or reschedule by 5 p.m. the business day prior to your appointment date, you will be charged a $50.00 late cancellation/no-show fee.    Thank you for choosing [facility] for your medical needs.  We appreciate the opportunity to care for you.  Please visit [place] at our website  to learn more about our practice.                     Sincerely,                                                             The Gastroenterology Division

## 2010-06-21 NOTE — Assessment & Plan Note (Signed)
Summary: NEW/GOING [place],WITH ALOT OF PROBLEM//PH   Vital Signs:  Patient profile:   77 year old female Weight:      126 pounds Pulse rate:   66 / minute Resp:     16 per minute BP sitting:   112 / 60  (left arm)  Vitals Entered By: Doristine Devoid (June 07, 2009 9:29 AM) CC: new est- multiple concerns    CC:  new est- multiple concerns .  History of Present Illness: [name] is an 77 year old female who presents today for a complete physical.  Patient has not seen a health care provider for "several years."  Patient lives alone- recently gave up driving.  She presents today with her daughter and nephew.  Family brings in a list of concerns which include:  hemorrhoids- had considerable bleeding last May, since that time she has noticed occasional blood on the tissue after wiping, has used OTC gel and witch hazel.  Also with LE edema which started November 2010, this is accompanied by DOE.    Family notes intermittent episodes of confusion.    Patient notes several years ago her left arm became limp briefly.  Still notes some mild weakness in the left arm.    OA-  "all over", s/p THR 20 years ago.  , she notes some trouble dressing.    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 Resp: Denies cough CV:  Denies Chest Pain, denies orthopnea GI:  Denies nausea or vomitting GU: notes some difficulty urinating Lymphatic: Denies lymphadenopathy Musculoskeletal:  notes joint  pain in multiple joints Skin:  notes some redness bilateral shins Psychiatric: Denies depression Neuro: notes some numbness in her left hand     Physical Exam  General:  Frail elderly white female, NAD Head:  Normocephalic and atraumatic without obvious abnormalities. No apparent alopecia or balding. Eyes:  PERRLA, + periorbital edema beneath bilateral eyes Ears:  External ear exam shows no significant lesions or deformities.  Otoscopic examination reveals clear canals, tympanic membranes are intact bilaterally without bulging, retraction, inflammation or discharge. Hearing is grossly normal bilaterally. Mouth:  Oral mucosa and oropharynx without lesions or exudates.   Neck:  No deformities, masses, or tenderness noted. Lungs:  Diminished breath sounds bilateral bases right greater than left Heart:  s1/s2, irregular, no carotid bruits noted Abdomen:  Bowel sounds positive,abdomen mildly distended non-tender without masses, organomegaly or hernias noted. Rectal:  large rectal prolapse Msk:  No deformity or scoliosis noted of thoracic or lumbar spine.   Extremities:  + 3 edema extending bilateral lower extremities up to mid thigh Neurologic:  strength normal in all extremities.   Skin:  some mild redness noted bilateral shins Cervical Nodes:  No lymphadenopathy noted Psych:   Alert and cooperative with normal attention span and concentration. No apparent delusions, illusions, hallucinations   Impression & Recommendations:  Problem # 1:  Preventive Health Care (ICD-V70.0) Will defer labs pending hospital admission- patient needs mammo, dexascan, FLP when feeling better, immunizations reviewed. Orders: Venipuncture (36644)  Problem # 2:  ATRIAL  FIBRILLATION WITH RAPID VENTRICULAR RESPONSE (ICD-427.31) Assessment: New Given patient's symptoms of decompensated CHF and new finding of AF with RVR will plan admission to [facility] under [facility] Cardiology service, Dietrich Pates  MD).  Spoke with E[facility] Pet[facility] and their team has accepted patient for direct admission.  I also question the possiblity that the patient may have had a remote CVA, given history of Left arm weakness/numbness- this symptom has been present for 3 years.  ? need for anticoagulation- defer to cardiology team.    Problem # 3:  CHF (ICD-428.0) Assessment: New  Will need 2-D echo, and likely iv diuresis. - Defer to cardiology  Orders: EKG w/ Interpretation (93000)  Problem # 4:  RECTAL PROLAPSE (ICD-569.1) Assessment: New Patient with new large rectal prolapse- plan referral to surgery when stable from a cardiac standpoint.   Orders: Surgical Referral (Surgery)  Other Orders: Echo Referral (Echo) Cardiology Referral (Cardiology) Pneumococcal Vaccine (16109) Admin 1st Vaccine (60454) Flu Vaccine 22yrs + (09811) Administration Flu vaccine - MCR (B1478)  Patient Instructions: 1)  Please procede directly to the [facility] admitting department.   2)  You will be contacted about your bone density test, mammogram, echocardiogram, surgery referral and cardiology referral and CT scan. 3)  Should you become more short of breath please go to the ER.   Preventive Care Screening  Colonoscopy:    Date:  09/20/2000    Results:  normal   Pap Smear:    Date:  02/09/2000    Results:  normal   Prevention & Chronic Care Immunizations   Influenza vaccine: Fluvax 3+  (06/07/2009)    Tetanus booster: Not documented    Pneumococcal vaccine: Pneumovax  (06/07/2009)    H. zoster vaccine: Not documented  Colorectal Screening   Hemoccult: Not documented    Colonoscopy: normal  (09/20/2000)  Other Screening   Pap smear: normal  (02/09/2000)    Mammogram: Not documented    DXA bone density scan: Not documented   Smoking status: Not documented  Lipids   Total Cholesterol: Not documented   LDL: Not documented   LDL Direct: Not documented   HDL: Not documented   Triglycerides: Not  documented    Preventive Care Screening  Colonoscopy:    Date:  09/20/2000    Results:  normal   Pap Smear:    Date:  02/09/2000    Results:  normal     Immunizations Administered:  Pneumonia Vaccine:    Vaccine Type: Pneumovax    Site: left deltoid    Mfr: Merck    Dose: 0.5 ml    Route: IM    Given by: Doristine Devoid    Exp. Date: 06/17/2010    Lot #: [id number]         Flu Vaccine Consent Questions     Do you have a history of severe allergic reactions to this vaccine? no    Any prior history of allergic reactions to egg and/or gelatin? no    Do you have a sensitivity to the preservative Thimersol? no    Do you have a past history of Guillan-Barre Syndrome? no    Do you currently have an acute febrile illness? no    Have you ever had a severe reaction to latex? no    Vaccine information given and explained to patient? yes    Are you currently pregnant? no    Lot Number:[id number]   Exp Date:11/18/2009   Site Given  Right Deltoid IMlu

## 2010-06-21 NOTE — Medication Information (Signed)
Summary: Coumadin Clinic  Anticoagulant Therapy  Managed by: Bethena Midget, RN, BSN Referring MD: Dietrich Pates, MD PCP: Tonie Griffith, MD Supervising MD: Excell Seltzer MD, Casimiro Needle Indication 1: Atrial Fibrillation Lab Used: [facility] [place] Site: [address] PT 17.7 INR POC 1.8 INR RANGE 2-3  Dietary changes: no    Health status changes: no    Bleeding/hemorrhagic complications: no    Recent/future hospitalizations: no    Any changes in medication regimen? no    Recent/future dental: no  Any missed doses?: no       Is patient compliant with meds? yes       Allergies: No Known Drug Allergies  Anticoagulation Management History:      Her anticoagulation is being managed by telephone today.  Positive risk factors for bleeding include an age of 56 years or older and presence of serious comorbidities.  The bleeding index is 'intermediate risk'.  Positive CHADS2 values include History of CHF and Age > 75 years old.  Prothrombin time is 17.7.  Anticoagulation responsible provider: Excell Seltzer MD, Casimiro Needle.  INR POC: 1.8.    Anticoagulation Management Assessment/Plan:      The patient's current anticoagulation dose is Warfarin sodium 2.5 mg tabs: Use as directed by Anticoagualtion Clinic.  The target INR is 2.0-3.0.  The next INR is due 09/20/2009.  Anticoagulation instructions were given to home health nurse/[facility].  Results were reviewed/authorized by Bethena Midget, RN, BSN.  She was notified by Bethena Midget, RN, BSN.         Prior Anticoagulation Instructions: INR 5.7  Hold Coumadin Saturday and Sunday then recheck INR on Monday.  Orders given to Delice Bison with [facility] while in pt's home.   Current Anticoagulation Instructions: INR 1.8 Today 5mg s then resume 2.5mg s daily. Recheck in one week.

## 2010-06-21 NOTE — Medication Information (Signed)
Summary: Coumadin Clinic  Anticoagulant Therapy  Managed by: Shelby Dubin, PharmD, BCPS, CPP Referring MD: Dietrich Pates, MD PCP: Tonie Griffith, MD Supervising MD: Ladona Ridgel MD, Sharlot Gowda Indication 1: Atrial Fibrillation Lab Used: [facility] [place] Site: [address] PT 15.8 INR POC 1.6 INR RANGE 2-3  Dietary changes: no    Health status changes: no    Bleeding/hemorrhagic complications: no    Recent/future hospitalizations: no    Any changes in medication regimen? no    Recent/future dental: no  Any missed doses?: no       Is patient compliant with meds? yes      Comments: Called by [id number] ([facility]) on 3/11  Allergies (verified): No Known Drug Allergies  Anticoagulation Management History:      The patient is taking warfarin and comes in today for a routine follow up visit.  Positive risk factors for bleeding include an age of 75 years or older.  The bleeding index is 'intermediate risk'.  Positive CHADS2 values include History of CHF and Age > 50 years old.  Prothrombin time is 15.8.  Anticoagulation responsible provider: Ladona Ridgel MD, Sharlot Gowda.  INR POC: 1.6.  Exp: 08/2010.    Anticoagulation Management Assessment/Plan:      The patient's current anticoagulation dose is Warfarin sodium 2.5 mg tabs: Use as directed by Anticoagualtion Clinic.  The target INR is 2.0-3.0.  The next INR is due 08/04/2009.  Anticoagulation instructions were given to patient/[place].  Results were reviewed/authorized by Shelby Dubin, PharmD, BCPS, CPP.  She was notified by [facility]  / Bethena Midget, RN.         Prior Anticoagulation Instructions: INR 1.5 Change dose to 2.5mg s daily.  Recheck on Friday. Orders given to [place] with [facility] with dose and to recheck in 4 days.   Current Anticoagulation Instructions: INR 1.6  Take 2 tabs for 2 days, then resume 1 tab daily.  Recheck in 5 - 6 days.   Orders to [place], Charity fundraiser at 1250.

## 2010-06-21 NOTE — Medication Information (Signed)
Summary: Coumadin Clinic  Anticoagulant Therapy  Managed by: Bethena Midget, RN, BSN Referring MD: Dietrich Pates, MD PCP: Tonie Griffith, MD Supervising MD: Juanda Chance MD, Jeananne Bedwell Indication 1: Atrial Fibrillation Lab Used: [facility] [place] Site: [address] PT 19.6 INR POC 2.0 INR RANGE 2-3  Dietary changes: no    Health status changes: no    Bleeding/hemorrhagic complications: yes       Details: Continues to have scant-mod. amt of bloody stool, rectal Prolapse.   Recent/future hospitalizations: no    Any changes in medication regimen? no    Recent/future dental: no  Any missed doses?: no       Is patient compliant with meds? yes       Allergies: No Known Drug Allergies  Anticoagulation Management History:      Her anticoagulation is being managed by telephone today.  Positive risk factors for bleeding include an age of 75 years or older and presence of serious comorbidities.  The bleeding index is 'intermediate risk'.  Positive CHADS2 values include History of CHF and Age > 71 years old.  Prothrombin time is 19.6.  Anticoagulation responsible provider: Juanda Chance MD, Smitty Cords.  INR POC: 2.0.    Anticoagulation Management Assessment/Plan:      The patient's current anticoagulation dose is Warfarin sodium 2.5 mg tabs: Use as directed by Anticoagualtion Clinic.  The target INR is 2.0-3.0.  The next INR is due 09/27/2009.  Anticoagulation instructions were given to home health nurse/[facility].  Results were reviewed/authorized by Bethena Midget, RN, BSN.  She was notified by Bethena Midget, RN, BSN.         Prior Anticoagulation Instructions: INR 1.8 Today 5mg s then resume 2.5mg s daily. Recheck in one week.   Current Anticoagulation Instructions: INR 2.0 Continue 2.5mg  daily. Orders given to Delice Bison with [facility] while at home with pt.

## 2010-06-21 NOTE — Miscellaneous (Signed)
Summary: SN Orders/[facility]  SN Orders/[facility]   Imported By: Lanelle Bal 09/27/2009 11:14:42  _____________________________________________________________________  External Attachment:    Type:   Image     Comment:   External Document

## 2010-06-21 NOTE — Miscellaneous (Signed)
Summary: Episode Summary/[facility]  Episode Summary/[facility]   Imported By: Lanelle Bal 10/27/2009 13:55:30  _____________________________________________________________________  External Attachment:    Type:   Image     Comment:   External Document

## 2010-06-21 NOTE — Letter (Signed)
Summary: [facility]   Imported By: Lester Salineno [place] 02/01/2010 09:56:58  _____________________________________________________________________  External Attachment:    Type:   Image     Comment:   External Document

## 2010-06-21 NOTE — Progress Notes (Signed)
  Phone Note Outgoing Call   Summary of Call: pls call [facility] and ask them to notify coumadin clinic with PT/INR levels they are managing. Initial call taken by: Lemont Fillers FNP,  August 06, 2009 6:04 PM  Follow-up for Phone Call        Spoke with Misty Stanley at [facility] [phone] and advised them to send PT/INR results to the cardiology office. Phone number was given to them to contact cardiology.  Follow-up by: Mervin Kung CMA,  August 09, 2009 8:40 AM

## 2010-06-21 NOTE — Progress Notes (Signed)
Summary: appt.  Phone Note Outgoing Call   Summary of Call: I see that patient was recently seen in the ER.  She is due for follow up in the office.  Pls call and arrange.  She established with me at [facility].  If she is unwilling to travel to [place], then we can try to get her set up with Dr. Beverely Low. Initial call taken by: Lemont Fillers FNP,  September 15, 2009 3:28 PM  Follow-up for Phone Call        Spoke to pt per [facility] instructions.  Pt states she is currently seeing the cardiologist and may have to have surgery.  She declines appt until she finds out what is going to happen with the cardiologist.  Mervin Kung CMA  September 16, 2009 2:01 PM

## 2010-06-21 NOTE — Progress Notes (Signed)
  Phone Note Call from Patient   Caller: Other Relative- Lindsey Ferguson

## 2010-06-21 NOTE — Letter (Signed)
Summary: [facility] Physician Verbal Order  [facility] Physician Verbal Order   Imported By: Roderic Ovens 03/04/2010 15:06:09  _____________________________________________________________________  External Attachment:    Type:   Image     Comment:   External Document

## 2010-06-21 NOTE — Miscellaneous (Signed)
Summary: PT & OT Orders/[facility]  PT & OT Orders/[facility]   Imported By: Lanelle Bal 07/09/2009 13:06:30  _____________________________________________________________________  External Attachment:    Type:   Image     Comment:   External Document

## 2010-06-21 NOTE — Miscellaneous (Signed)
Summary: Visit Orders/[facility]  Visit Orders/[facility]   Imported By: Lanelle Bal 07/01/2009 12:35:21  _____________________________________________________________________  External Attachment:    Type:   Image     Comment:   External Document

## 2010-06-21 NOTE — Medication Information (Signed)
Summary: Coumadin Clinic  Anticoagulant Therapy  Managed by: Bethena Midget, RN, BSN Referring MD: Dietrich Pates, MD PCP: Tonie Griffith, MD Supervising MD: Daleen Squibb MD, Maisie Fus Indication 1: Atrial Fibrillation Lab Used: [facility] [place] Site: [address] PT 21.1 INR POC 2.1 INR RANGE 2-3  Dietary changes: no    Health status changes: no    Bleeding/hemorrhagic complications: no    Recent/future hospitalizations: no    Any changes in medication regimen? no    Recent/future dental: no  Any missed doses?: no       Is patient compliant with meds? yes       Allergies: No Known Drug Allergies  Anticoagulation Management History:      Her anticoagulation is being managed by telephone today.  Positive risk factors for bleeding include an age of 75 years or older and presence of serious comorbidities.  The bleeding index is 'intermediate risk'.  Positive CHADS2 values include History of CHF and Age > 73 years old.  Prothrombin time is 21.1.  Anticoagulation responsible provider: Daleen Squibb MD, Maisie Fus.  INR POC: 2.1.    Anticoagulation Management Assessment/Plan:      The patient's current anticoagulation dose is Warfarin sodium 2.5 mg tabs: Use as directed by Anticoagualtion Clinic.  The target INR is 2.0-3.0.  The next INR is due 12/22/2009.  Anticoagulation instructions were given to home health nurse/[facility].  Results were reviewed/authorized by Bethena Midget, RN, BSN.  She was notified by Bethena Midget, RN, BSN.         Prior Anticoagulation Instructions: INR 1.8  Spoke with Delice Bison with [facility] while in pt's home.  Increase dose to 1 tablet every day except 1 1/2 tablets on Thursday.  Recheck INR in 1 week.   Current Anticoagulation Instructions: INR 2.1 Continue 2.5mg s daily except 3.75mg s on Thursdays.  Recheck in one week. Pt aware and [facility] nurse.

## 2010-06-21 NOTE — Discharge Summary (Signed)
NAME:  Lindsey Ferguson, Lindsey Ferguson NO.:  [id number]      MEDICAL RECORD NO.:  [id number]          PATIENT TYPE:  INP      LOCATION:  3704                         FACILITY:  [facility]      PHYSICIAN:  Luis Abed, MD, FACCDATE OF BIRTH:  06-Oct-1919      DATE OF ADMISSION:  06/07/2009   DATE OF DISCHARGE:  06/13/2009                                  DISCHARGE SUMMARY      PRIMARY CARDIOLOGIST:  Pricilla Riffle, MD, [facility]      PRIMARY CARE PHYSICIAN:  Sandford Craze, NP      DISCHARGE DIAGNOSES:   1. Acute mixed (systolic and diastolic) congestive heart failure.       a.     A 2-D echocardiogram June 07, 2009:  Left ventricular        ejection fraction equal to 45% with mild global hypokinesis.        Appeared to be restrictive diastolic dysfunction with left        ventricular end-diastolic pressure at least 20 mmHg.   2. Atrial fibrillation with rapid ventricular response.       a.     Being discharged on anticoagulation with Coumadin, INR        therapeutic.   3. Mitral regurgitation, moderate.   4. Tricuspid regurgitation, moderate.   5. Pulmonary hypertension, moderate.       a.     PA peak pressure of 54 mmHg (S).   6. Prolapsed rectum.   7. Constipation.      SECONDARY DIAGNOSES:   1. Osteoarthritis.   2. Questionable history of transient ischemic attack/cerebrovascular       accident without sequelae.   3. Status post total hip arthroplasty, 1988.   4. Status post bladder suspension.   5. Status post hysterectomy.   6. Status post arthroscopic surgery x2.      ALLERGIES:  NKDA.      PROCEDURES:   1. EKG, June 07, 2009:  Atrial fibrillation with premature       ventricular contractions, no ischemic changes, questionable       significant Q-waves/anteroseptal myocardial infarction.  Left       anterior descending, no evidence of hypertrophy.  QRS 74, QTc 462.       No prior tracing for comparison.   2. CXR, June 07, 2009:  Cardiac enlargement.   Chronic-appearing       lung changes/chronic obstructive pulmonary disease.  Bibasilar lung       opacities reflect atelectasis or developing infiltrates.  No       pulmonary edema.  Small bilateral pleural effusions.   3. A 2-D echocardiogram completed June 07, 2009:  LV cavity size       normal, wall thickness normal, systolic function/LVEF equal to 45%       with mild hypokinesis, appeared to be restrictive diastolic       function with LV end-diastolic pressure at least 20 mmHg.       Moderately calcified mitral annulus with moderate regurgitation.  Left atrium moderately dilated.  Right ventricle cavity size       moderately dilated with mildly reduced systolic function.  Right       atrium, moderate-to-severely dilated.  Moderate tricuspid       regurgitation.  Pulmonary arteries with PA peak pressure at 54 mmHg       (S).  IVC dilated to about 25 mm with minimal respirophasic       variation.  RA pressure was at least 15 mmHg.      HISTORY OF PRESENT ILLNESS:  Lindsey Ferguson is an 77 year old Caucasian female   with no history of CAD, nor any other cardiac history, and only   significant medical history being a question of TIA/CVA 13 years ago   with no sequelae who presented to [facility] on June 07, 2009, after not having been seen by physician for several years,   complaining of dyspnea on exertion, lower extremity edema, and bright   red blood per rectum.  An EKG was completed in the office, the patient   subsequently found to be in atrial fibrillation with RVR up to 120s.      The patient reported that she had been noting lower extremity edema,   worsening over the last couple of months along with increasing dyspnea   on exertion.  However, she denies orthopnea and PND.  The patient also   denies chest pain at any time as well as tachypalpitations or pre-   syncope.  Her only other complaint today at PCP's office was BRBPR   recently.  She notes that  when this first started there was quite a bit   of blood, but since then that has been in small amounts.  Per the office   note, the patient was examined and has been diagnosed a prolapsed   rectum.      On admission at [facility], no lab work nor any imaging has yet   been completed.  Vital signs are stable with systolic blood pressures in   the 110s-120s and heart rate in the 60s to 120s, respiration rate in   teens to low 20s, and O2 saturation 98% on room air.  She is afebrile.   Telemetry confirmed she is still in atrial fibrillation with moderately   good rate control.  It is also of note that she is basically   asymptomatic at rest.      HOSPITAL COURSE:  The patient was admitted and anticoagulation with   Coumadin was initiated.  The patient was rate controlled very easily   using Coreg, which had the added benefits of improved ventricular   filling secondary to her diastolic dysfunction.  The patient was also   started on an ACE inhibitor due to her new diagnosis of heart failure.   She was diuresed with IV Lasix and renal function as well as blood   pressures were followed very closely.  The patient was evaluated by   PT/OT and deemed to be improving slowly and steadily during her hospital   course.  Notes that there was no significant complaints from the patient   other than constipation during her hospital course for which she   received p.r.n. stool softener/motility increasing meds.  The patient's   INR was slowly rising and was therapeutic on June 12, 2009.  However,   she was still mildly volume overloaded requiring further IV diuresis   until June 13, 2009.  She  remained in the therapeutic range in   regards to her INR and per assessment by Dr. Willa Rough, she was no   longer in need of diuresis inpatient.  She will follow up in our   Coumadin Clinic next week, probably Wednesday (to be set up on Monday).   She will also be following up with Dr. Tenny Craw in  1-2 weeks.  Outstanding   issues to be resolved are when and if to schedule DC CV, also a stress   Myoview may need to be completed as part as risk assessment for any   upcoming surgical intervention for the previously mentioned diagnosis of   rectal prolapse.  Of note, the patient will also be following up with   Surgery in regards to this issue.  One of her Coumadin Clinic   appointment is set up.  She will also have a basic metabolic panel drawn   that date to continue to keep a close eye on her potassium level as well   as renal function.  At the time of discharge, the patient was given her   new medication list including beta-blockade with Coreg, ACE inhibitor   (lisinopril), Coumadin, prescription/instructions, prescription for   Lasix as well as potassium supplements.  She will also be instructed to   stop taking ibuprofen p.r.n. for pain and switched to acetaminophen.   Otherwise, the patient will continue on her multivitamin, vitamin D as   she had been doing prior to admission.  All questions and concerns were   addressed prior to her leaving the hospital.      DISCHARGE LABS:  WBC 6.2, HGB 13.3, HCT 39.1, PLT count is 171.  Protime   29.4, INR 28.1.  Sodium 141, potassium 4.1, chloride 104, CO2 of 30, BUN   24, creatinine 0.77, glucose 90, calcium 9.3.  BNP 1297 on admission,   last check on June 11, 2009, at 799, total cholesterol 137,   triglycerides 60, HDL 44, LDL 81, VLDL 12.  TSH 2.976.      FOLLOWUP PLANS AND APPOINTMENTS:   1. Coumadin Clinic and BMET to be drawn next weeks.   2. Dr. Huston Foley in 1-2 weeks.   3. Surgery, as previously discussed with surgery by the patient.      DISCHARGE MEDICATIONS:   1. Acetaminophen 325 mg 1-2 tablets q.6 h. p.r.n. for pain.   2. Carvedilol 6.25 mg p.o. b.i.d. with meals.   3. Lasix 40 mg p.o. b.i.d.   4. Lisinopril 10 mg p.o. daily.   5. Potassium chloride 20 mg p.o. b.i.d.   6. Warfarin 2.5 mg 1-2 tablets (1 tablet  Tuesday, Thursday, Saturday       and Sunday and 2 tablets Monday, Wednesday, Friday).   7. Multivitamin daily.   8. Vitamin D - OTC daily.      Duration of discharge encounter including physician time was 35 minutes.               Jarrett Ables, PAC         ______________________________   Luis Abed, MD, [facility]         MS/MEDQ  D:  06/13/2009  T:  06/13/2009  Job:  [id number]      cc:   Pricilla Riffle, MD, [facility]   Sandford Craze, NP

## 2010-06-21 NOTE — Miscellaneous (Signed)
Summary: Care Plan/[facility]  Care Plan/[facility]   Imported By: Lanelle Bal 07/01/2009 12:22:18  _____________________________________________________________________  External Attachment:    Type:   Image     Comment:   External Document

## 2010-06-21 NOTE — Progress Notes (Signed)
  Phone Note Other Incoming   Caller: [facility] nurse Details for Reason: Low b/p  Summary of Call: [facility] home nurse informed me that pt blood pressure is starting to drop again since she's been home from hospital. On Monday she states that the therapist stated B/P 80/60 and today it was 100/60. Dr Tenny Craw had discontinued Lisinopril and had lasix on hold from April OV. But these meds were added back to her regimen at discharge on 11/24/09. Thus, Annice Pih, Dr Tenny Craw nurse stated to hold Lisinopril and recheck BP on Friday. Also we ordered CBC for Friday as well.  Initial call taken by: Bethena Midget, RN, BSN,  December 15, 2009 10:36 AM

## 2010-06-21 NOTE — Letter (Signed)
Summary: [facility]  [address] [place], [place] [address]   Phone: [phone]  Fax: [phone]       NYRIAH COOTE    [birth year]    MRN: [id number]        Procedure Day /Date:TUESDAY 07/06/2009     Arrival Time:1:30PM     Procedure Time:2:30PM     Location of Procedure:                    X   [facility] (4th Floor)   PREPARATION FOR FLEXIBLE SIGMOIDOSCOPY WITH MAGNESIUM CITRATE  Prior to the day before your procedure, purchase one 8 oz. bottle of Magnesium Citrate and one Fleet Enema from the laxative section of your drugstore.  _________________________________________________________________________________________________  THE DAY BEFORE YOUR PROCEDURE             DATE: 07/05/2009   DAY: MONDAY  1.   Have a clear liquid dinner the night before your procedure.  2.   Do not drink anything colored red or purple.  Avoid juices with pulp.  No orange juice.              CLEAR LIQUIDS INCLUDE: Water Jello Ice Popsicles Tea (sugar ok, no milk/cream) Powdered fruit flavored drinks Coffee (sugar ok, no milk/cream) Gatorade Juice: apple, white grape, white cranberry  Lemonade Clear bullion, consomm, broth Carbonated beverages (any kind) Strained chicken noodle soup Hard Candy   3.   At 7:00 pm the night before your procedure, drink one bottle of Magnesium Citrate over ice.  4.   Drink at least 3 more glasses of clear liquids before bedtime (preferably juices).  5.   Results are expected usually within 1 to 6 hours after taking the Magnesium Citrate.  ___________________________________________________________________________________________________  THE DAY OF YOUR PROCEDURE            DATE: 07/06/2009 DAY: TUESDAY  1.   Use Fleet Enema one hour prior to coming for procedure.  2.   You may drink clear liquids until 12:30PM (2 hours before exam)       MEDICATION INSTRUCTIONS  Unless otherwise instructed,  you should take regular prescription medications with a small sip of water as early as possible the morning of your procedure.  Stop taking Coumadin on  _  _  (5 days before procedure). STAY ON COUMADIN PER DR KAPLAN         OTHER INSTRUCTIONS  You will need a responsible adult at least 75 years of age to accompany you and drive you home.   This person must remain in the waiting room during your procedure.  Wear loose fitting clothing that is easily removed.  Leave jewelry and other valuables at home.  However, you may wish to bring a book to read or an iPod/MP3 player to listen to music as you wait for your procedure to start.  Remove all body piercing jewelry and leave at home.  Total time from sign-in until discharge is approximately 2-3 hours.  You should go home directly after your procedure and rest.  You can resume normal activities the day after your procedure.  The day of your procedure you should not:   Drive   Make legal decisions   Operate machinery   Drink alcohol   Return to work  You will receive specific instructions about eating, activities and medications before you leave.   The above instructions have been reviewed and explained to me by  _______________________    I fully understand and can verbalize these instructions _____________________________ Date _________

## 2010-06-21 NOTE — Medication Information (Signed)
Summary: Lab Orders  Lab Orders   Imported By: Marylou Mccoy 12/29/2009 14:23:09  _____________________________________________________________________  External Attachment:    Type:   Image     Comment:   External Document

## 2010-06-21 NOTE — Assessment & Plan Note (Signed)
Summary: rov   Visit Type:  Follow-up Primary Provider:  Tonie Griffith, MD  CC:  no complaints.  History of Present Illness: patient is a 77 year old with a history of atrial fibrillation, CHF (mild LV dysfunction by echo), pulmonary hypertension.  I last saw her in April .  She had a myoview after this that showed no ischemia. After surgery she had some hypotension and i recommended that she hold lisinopril.  She has dones this and her bp has been better.  She denies dizziness. She is unsteady on her feet but she has not fallen.  She is careful. Denies palpitations.  Does give out with activity but this is unchanged.  Current Medications (verified): 1)  Carvedilol 6.25 Mg Tabs (Carvedilol) .... Take One Tablet By Mouth Twice A Day 2)  Furosemide 40 Mg Tabs (Furosemide) .... Take One Tablet By Mouth Daily. 3)  Warfarin Sodium 2.5 Mg Tabs (Warfarin Sodium) .... Use As Directed By Anticoagualtion Clinic 4)  Multivitamins  Tabs (Multiple Vitamin) 5)  Vitamin D 1000 Unit Tabs (Cholecalciferol) 6)  Klor-Con M20 20 Meq Cr-Tabs (Potassium Chloride Crys Cr) .... Take One Tablet Daily 7)  Acetaminphen .... As Needed  Allergies: No Known Drug Allergies  Past History:  Past medical, surgical, family and social histories (including risk factors) reviewed, and no changes noted (except as noted below).  Past Medical History: Reviewed history from 06/25/2009 and no changes required. Arthritis Chicken Pox Atrial fibrillation CHF Rectal prolapse  Past Surgical History: Reviewed history from 07/05/2009 and no changes required. Hip Replacement-1988 Bladder Tack Arthroscopic surgery x2  Family History: Reviewed history from 06/07/2009 and no changes required. CAD-no HTN-no DM-no COLON CA-no BREAST CA-no STROKE-father-subarachnoid hemorrhage BRAIN CA-mother and brother  Social History: Reviewed history from 06/07/2009 and no changes required. 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 EOMI, PERRLA.  Neck: JVP is normal. No thyromegaly. No bruits.  Lungs: clear to auscultation. No rales no wheezes.  Heart: Irregular rate and rhythm. Normal S1, S2. No S3.   No significant murmurs. PMI not displaced.  Abdomen:  Supple, nontender. Normal bowel sounds. No masses. No hepatomegaly.  Extremities:   Good distal pulses throughout. No lower extremity edema.  Musculoskeletal :moving all extremities.  Neuro:   alert and oriented x3.    Impression & Recommendations:  Problem # 1:  ATRIAL FIBRILLATION WITH RAPID VENTRICULAR RESPONSE (ICD-427.31) patients heart rates apper to be controlled.  I would not change her regimen.   She is on coumadin now.  If she starts falling then I would stop and Rx with ASA.  Not now.   I will see her in 12 months time.  SOoner if symptoms develop Continue coumadin for now.  If she starts to fall I would switch her to ASA.  Problem # 2:  EDEMA (ICD-782.3) Trivial  She can take 1 1/2 lasix 1x per week if needed.  Eat banana.  Watch Na  Patient Instructions: 1)  Your physician recommends that you schedule a follow-up appointment  in: 12 months. The office will mail you a reminder letter 2 months prior appointment date.

## 2010-06-21 NOTE — Medication Information (Signed)
Summary: Coumadin Clinic  Anticoagulant Therapy  Managed by: Bethena Midget, RN, BSN Referring MD: Dietrich Pates, MD PCP: Tonie Griffith, MD Supervising MD: Graciela Husbands MD, Viviann Spare Indication 1: Atrial Fibrillation Lab Used: [facility] [place] Site: [address] PT 12.7 INR POC 1.3 INR RANGE 2-3  Dietary changes: no    Health status changes: no    Bleeding/hemorrhagic complications: no    Recent/future hospitalizations: no    Any changes in medication regimen? no    Recent/future dental: no  Any missed doses?: no       Is patient compliant with meds? yes      Comments: 11/24/09 Pt came home from a stay at [facility]   Allergies: No Known Drug Allergies  Anticoagulation Management History:      Her anticoagulation is being managed by telephone today.  Positive risk factors for bleeding include an age of 75 years or older and presence of serious comorbidities.  The bleeding index is 'intermediate risk'.  Positive CHADS2 values include History of CHF and Age > 16 years old.  Prothrombin time is 12.7.  Anticoagulation responsible provider: Graciela Husbands MD, Viviann Spare.  INR POC: 1.3.    Anticoagulation Management Assessment/Plan:      The patient's current anticoagulation dose is Warfarin sodium 2.5 mg tabs: Use as directed by Anticoagualtion Clinic.  The target INR is 2.0-3.0.  The next INR is due 12/08/2009.  Anticoagulation instructions were given to home health nurse/[facility].  Results were reviewed/authorized by Bethena Midget, RN, BSN.  She was notified by Bethena Midget, RN, BSN.         Prior Anticoagulation Instructions: INR 2.1  Current Anticoagulation Instructions: INR 1.3 Today 3.75mg s and Friday, then 2.5mg s daily. Recheck in one week. Orders given to Delice Bison with [facility] while at home with Pt.

## 2010-06-21 NOTE — Progress Notes (Signed)
Summary: TRIAGE  Phone Note Call from Patient   Caller: Darl Pikes [phone] niece & her ride here Call For: Dr Arlyce Dice Reason for Call: Talk to Nurse Summary of Call: Wonders if she can be seen sooner than 07-15-09 because of her Rectal Prolapse is having a real difficult time with this. This is a new pt. Initial call taken by: Leanor Kail [facility],  July 01, 2009 10:10 AM  Follow-up for Phone Call        Per pt. niece-Pt. has a rectal prolapse, saw Dr.Weatherly who declined surgery, wants pt. to go to [place] or [place].  Pt. family wants pt. seen by a GI MD for further evaluation. Pt. will see Dr.Lita Flynn on 07-05-09 at 4pm, family will have report from Dr.Weatherly faxed over. Pt. instructed to call back as needed.  Follow-up by: Laureen Ochs LPN,  July 01, 2009 10:40 AM

## 2010-06-21 NOTE — Assessment & Plan Note (Signed)
Summary: eph/per hrs sheet/jss   Visit Type:  Follow-up Primary Provider:  Dr. Peggyann Juba  CC:  no cardaic complaints today.  History of Present Illness: Patient is a 77 year old with a  history of atrial fibrillation, CHF with mild LV dysfunction by echo.  She was recently admitted to [facility] (1/17) with CHF and Afib with RVR (new).  Rate was controlled.  coumadin started.  Patient was diuresed. Since d/c, the patient has not complained of shortness of breasth.  She notes occasionally her head does not feel quite right.  She has held her Coreg when this happens.  She has not taken her bp. Biggest complaint is her rectal prolapse.  Per her son, Dr. Zachery Dakins has turned her down for surgery.  Due to see Dr. Arlyce Dice  Current Medications (verified): 1)  Carvedilol 6.25 Mg Tabs (Carvedilol) .... Take One Tablet By Mouth Twice A Day 2)  Lisinopril 10 Mg Tabs (Lisinopril) .... Take One Tablet By Mouth Daily 3)  Furosemide 40 Mg Tabs (Furosemide) .... Take One Tablet By Mouth Daily. 4)  Warfarin Sodium 2.5 Mg Tabs (Warfarin Sodium) .... Use As Directed By Anticoagualtion Clinic 5)  Multivitamins  Tabs (Multiple Vitamin) 6)  Vitamin D 1000 Unit Tabs (Cholecalciferol) 7)  Klor-Con M20 20 Meq Cr-Tabs (Potassium Chloride Crys Cr) .... Take One Tablet Daily 8)  Acetaminphen .... 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   Negative to the above except as noted.  Vital Signs:  Patient profile:   77 year old female Height:      61 inches Weight:      94 pounds Pulse rate:   67 / minute Pulse rhythm:   irregular BP sitting:   117 / 66  (left arm) Cuff size:   regular  Vitals Entered By: Burnett Kanaris, CNA (June 25, 2009 10:34 AM)  Physical Exam  Additional Exam:  patient in NAD in wheelchair HEENT:  Normocephalic, atraumatic. EOMI, PERRLA.  Neck: JVP is normal. No thyromegaly. No bruits.  Lungs: clear to auscultation. No rales no wheezes.  Heart: Irregular rate and rhythm. Normal S1, S2. No S3.   No significant murmurs. PMI not displaced.  Abdomen:  Supple, nontender. Normal bowel sounds. No masses. No hepatomegaly.  Extremities:   Good distal pulses throughout. No lower extremity edema.  Musculoskeletal :moving all extremities.  Neuro:   alert and oriented x3.    EKG  Procedure date:  06/25/2009  Findings:      Atril fibrillation.  67 bpm  Impression & Recommendations:  Problem # 1:  CHF (ICD-428.0) Volume status is markedly improved than when I saw her in the hospital I will check labs for now. Her LV dysfunction by echo is mild.  May be tachycardic induced.  Options are f/u once on rate control, possibly cardioverted.  Other option is myoview to r/o ischemia.  If she has planned surgery may need to do this.  Keep on meds for now.  Check labs. Her updated medication list for  this problem includes:    Carvedilol 6.25 Mg Tabs (Carvedilol) .Marland Kitchen... Take one tablet by mouth twice a day    Lisinopril 10 Mg Tabs (Lisinopril) .Marland Kitchen... Take one tablet by mouth daily    Furosemide 40 Mg Tabs (Furosemide) .Marland Kitchen... Take one tablet by mouth daily.    Warfarin Sodium 2.5 Mg Tabs (Warfarin sodium) ..... Use as directed by anticoagualtion clinic  Orders: TLB-BMP (Basic Metabolic Panel-BMET) (80048-METABOL) TLB-BNP (B-Natriuretic Peptide) (83880-BNPR)  Problem # 2:  ATRIAL FIBRILLATION WITH RAPID  VENTRICULAR RESPONSE (ICD-427.31) Rates better.  continue.  Told her to check BP if feels woozy. Her updated medication list for this problem includes:    Carvedilol 6.25 Mg Tabs (Carvedilol) .Marland Kitchen... Take one tablet by mouth twice a day    Warfarin Sodium 2.5 Mg Tabs (Warfarin sodium) ..... Use as directed by anticoagualtion clinic  Orders: EKG w/ Interpretation (93000)  Problem # 3:  RECTAL PROLAPSE (ICD-569.1) Patient to see Darrel Hoover  Patient Instructions: 1)  Your physician recommends that you return for lab work in: lab work today...we will call you with results 2)  Your physician recommends that you schedule a follow-up appointment in: 2 months

## 2010-06-21 NOTE — Letter (Signed)
Summary: Outpatient Coinsurance Notice   Outpatient Coinsurance Notice   Imported By: Marylou Mccoy 09/17/2009 16:47:40  _____________________________________________________________________  External Attachment:    Type:   Image     Comment:   External Document

## 2010-06-21 NOTE — Medication Information (Signed)
Summary: Coumadin Clinic  Anticoagulant Therapy  Managed by: Weston Brass, PharmD Referring MD: Dietrich Pates, MD PCP: Tonie Griffith, MD Supervising MD: Juanda Chance MD, Elaijah Munoz Indication 1: Atrial Fibrillation Lab Used: [facility] [place] Site: [address] PT 17.7 INR POC 1.8 INR RANGE 2-3  Dietary changes: yes       Details: ate salad on Sunday  Health status changes: no    Bleeding/hemorrhagic complications: no    Recent/future hospitalizations: no    Any changes in medication regimen? no    Recent/future dental: no  Any missed doses?: no       Is patient compliant with meds? yes       Allergies: No Known Drug Allergies  Anticoagulation Management History:      Her anticoagulation is being managed by telephone today.  Positive risk factors for bleeding include an age of 75 years or older and presence of serious comorbidities.  The bleeding index is 'intermediate risk'.  Positive CHADS2 values include History of CHF and Age > 14 years old.  Prothrombin time is 17.7.  Anticoagulation responsible provider: Juanda Chance MD, Smitty Cords.  INR POC: 1.8.  Exp: 08/2010.    Anticoagulation Management Assessment/Plan:      The patient's current anticoagulation dose is Warfarin sodium 2.5 mg tabs: Use as directed by Anticoagualtion Clinic.  The target INR is 2.0-3.0.  The next INR is due 02/23/2010.  Anticoagulation instructions were given to home health nurse/[facility].  Results were reviewed/authorized by Weston Brass, PharmD.  She was notified by Weston Brass PharmD.         Prior Anticoagulation Instructions: INR 1.5  Spoke with Delice Bison, [facility] nurse while in pt's home.  Take 2 tablets today and tomorrow then resume same dose of 1 tablet every day except 1 1/2 tablets on Thursday.  Recheck INR in 1 week.   Current Anticoagulation Instructions: INR 1.8  Take extra 1/2 tablet today then increase dose to 1 tablet every day except 1 1/2 tablets on Monday and Thursday.  Recheck  INR in 2 weeks.  Orders given to Delice Bison with [facility].

## 2010-06-21 NOTE — Miscellaneous (Signed)
  Clinical Lists Changes  Observations: Added new observation of NUCLEAR NOS: ________________________________________________________________________ Normal lexiscan myoview.  Patient is at low risk for major cardiac complicaition with upcoming surgery.  OK to proceed without any further testing.  Afib with any rapid rates ccan be treated in periop period as needed.  (09/14/2009 9:40)      Nuclear Study  Procedure date:  09/14/2009  Findings:      ________________________________________________________________________ Normal lexiscan myoview.  Patient is at low risk for major cardiac complicaition with upcoming surgery.  OK to proceed without any further testing.  Afib with any rapid rates ccan be treated in periop period as needed.   Appended Document:    CT Scan  Procedure date:  10/03/2008  Findings:      CT OF THE HEAD     CT HEAD    Findings: No acute intracranial abnormality.  Negative for   hemorrhage, mass lesion, or acute infarction.  Findings of chronic   small vessel ischemic disease involving the cerebral deep white   matter.  Bony calvarium is intact.  Age appropriate generalized   atrophy.    IMPRESSION:   No acute intracranial abnormality.   CXR  Procedure date:  10/02/2009  Findings:         IMPRESSION:   Cardiomegaly.  Probable pulmonary venous hypertension and early   interstitial edema.   CXR  Procedure date:  06/07/2009  Findings:      IMPRESSION:    1.  Cardiac enlargement.   2.  Chronic-appearing lung changes/COPD.   3.  Bibasilar lung opacities there reflect atelectasis or   developing infiltrates.   4.  No pulmonary edema.   5.  Small bilateral pleural effusions.

## 2010-06-21 NOTE — Progress Notes (Signed)
Summary: [facility] wants orders/LMTCB CY  Phone Note From Other Clinic   Caller: Nurse [facility] Summary of Call: [facility] wants order to do pt / inr on pt or does she need to be seen first?-pls call [phone] Initial call taken by: Glynda Jaeger,  November 30, 2009 9:44 AM  Follow-up for Phone Call        [facility] Scherrie Bateman, LPN  November 30, 2009 9:58 AM  Per Delice Bison calling back to speak with Suella Broad  November 30, 2009 10:18 AM   Additional Follow-up for Phone Call Additional follow up Details #1::        TARA AWARE MAY RESUME INR. ORDER OKAYED. spoke with pt unaware of appt on 711/11 rescheduled to sept  19 ,2011 at 1:45. Additional Follow-up by: Scherrie Bateman, LPN,  November 30, 2009 12:19 PM

## 2010-06-21 NOTE — Letter (Signed)
Summary: [facility]   Imported By: Lester [place] 07/09/2009 10:23:34  _____________________________________________________________________  External Attachment:    Type:   Image     Comment:   External Document

## 2010-06-21 NOTE — Miscellaneous (Signed)
Summary: SN & PT Orders/[facility]  SN & PT Orders/[facility]   Imported By: Lanelle Bal 09/08/2009 08:04:05  _____________________________________________________________________  External Attachment:    Type:   Image     Comment:   External Document

## 2010-06-21 NOTE — Letter (Signed)
Summary: Primary Care Consult Scheduled Letter  [facility] [place]  [address] [place], [place] [address]   Phone: [phone]  Fax: [phone]      06/07/2009 MRN: [id number]  LIANI CARIS [address] [place], [place]  [address]    Dear Ms. Polkowski,    We have scheduled an appointment for you.  At the recommendation of Sandford Craze, FNP, we have scheduled you a consult with Dr. Consuello Bossier with [facility] on 06-15-2009 arrive by 2:45pm.  Their address is [address], [place] [address]. The office phone number is [phone].  If this appointment day and time is not convenient for you, please feel free to call the office of the doctor you are being referred to at the number listed above and reschedule the appointment.    It is important for you to keep your scheduled appointments. We are here to make sure you are given good patient care.   Thank you,    Renee, Patient Care Coordinator [place] at [facility]

## 2010-06-21 NOTE — Procedures (Signed)
Summary: Flexible Sigmoidoscopy  Patient: Lindsey Ferguson Note: All result statuses are Final unless otherwise noted.  Tests: (1) Flexible Sigmoidoscopy (FLX)  FLX Flexible Sigmoidoscopy                             DONE     [facility]     [address]     [place], [place]  [address]           FLEXIBLE SIGMOIDOSCOPY PROCEDURE REPORT           PATIENT:  Lindsey Ferguson, Lindsey Ferguson  MR#:  [id number]     BIRTHDATE:  [birth year], 89 yrs. old  GENDER:  female           ENDOSCOPIST:  Barbette Hair. Arlyce Dice, MD     Referred by:           PROCEDURE DATE:  07/06/2009     PROCEDURE:  Flexible Sigmoidoscopy, diagnostic     ASA CLASS:  Class II     INDICATIONS:  rectal bleeding, rectal pain           MEDICATIONS:   Fentanyl 25 mcg IV, Versed 2 mg IV           DESCRIPTION OF PROCEDURE:   After the risks benefits and     alternatives of the procedure were thoroughly explained, informed     consent was obtained.  Digital rectal exam was performed and     revealed decreased sphincter tone.   The [id number]     endoscope was introduced through the anus and advanced to the     descending colon, without limitations.  The quality of the prep     was .  The instrument was then slowly withdrawn as the mucosa was     fully examined.     <<PROCEDUREIMAGES>>           Moderate diverticulosis was found (see image2, image3, and     image4). sigmoid to descending colon  The examination was     otherwise normal (see image5 and image6).   Retroflexed views in     the rectum revealed no abnormalities.    The scope was then     withdrawn from the patient and the procedure terminated.           COMPLICATIONS:  None           ENDOSCOPIC IMPRESSION:     1) Moderate diverticulosis     2) Otherwise normal examination.     RECOMMENDATIONS:     1) continue current meds     2) Call the office to schedule a followup office visit for 3     weeks           REPEAT EXAM:  No        ______________________________     Barbette Hair. Arlyce Dice, MD           CC:  Lelon Perla, DO, Consuello Bossier, MD           n.     Rosalie DoctorBarbette Hair. Terrance Usery at 07/06/2009 02:52 PM           Princella Ion, [id number]  Note: An exclamation mark (!) indicates a result that was not dispersed into the flowsheet. Document Creation Date: 07/06/2009 2:53 PM _______________________________________________________________________  (1) Order result status: Final Collection or observation date-time: 07/06/2009 14:48 Requested date-time:  Receipt date-time:  Reported date-time:  Referring Physician:   Ordering Physician: Melvia Heaps [phone]) Specimen Source:  Source: Launa Grill Order Number: [id number] Lab site:

## 2010-06-21 NOTE — Medication Information (Signed)
Summary: rov/eh  Anticoagulant Therapy  Managed by: Eda Keys, PharmD Referring MD: Dietrich Pates, MD PCP: Dr. Brock Ra MD: Graciela Husbands MD, Viviann Spare Indication 1: Atrial Fibrillation INR POC 3.1 INR RANGE 2-3  Dietary changes: no    Health status changes: no    Bleeding/hemorrhagic complications: yes       Details: rectal bleeding throughout this weekend, pt was dx with rectal prolapse recently  Recent/future hospitalizations: no    Any changes in medication regimen? no    Recent/future dental: no  Any missed doses?: no       Is patient compliant with meds? yes      Comments: re: rectal bleeding - patient has a physician appt today following INR check to evaluate rectal prolapse.  I have decreased coumadin dose by 10% and scheduled f/u appt in 1 week.  Patient had already taken her dose of coumadin today.  Current Medications (verified): 1)  Carvedilol 6.25 Mg Tabs (Carvedilol) .... Take One Tablet By Mouth Twice A Day 2)  Lisinopril 10 Mg Tabs (Lisinopril) .... Take One Tablet By Mouth Daily 3)  Furosemide 40 Mg Tabs (Furosemide) .... Take One Tablet By Mouth Daily. 4)  Warfarin Sodium 2.5 Mg Tabs (Warfarin Sodium) .... Use As Directed By Anticoagualtion Clinic 5)  Multivitamins  Tabs (Multiple Vitamin) 6)  Vitamin D 1000 Unit Tabs (Cholecalciferol)  Allergies (verified): No Known Drug Allergies  Anticoagulation Management History:      The patient is taking warfarin and comes in today for a routine follow up visit.  Positive risk factors for bleeding include an age of 75 years or older.  The bleeding index is 'intermediate risk'.  Positive CHADS2 values include History of CHF and Age > 75 years old.  Anticoagulation responsible provider: Graciela Husbands MD, Viviann Spare.  INR POC: 3.1.  Cuvette Lot#: [id number].  Exp: 08/2010.    Anticoagulation Management Assessment/Plan:      The patient's current anticoagulation dose is Warfarin sodium 2.5 mg tabs: Use as directed by  Anticoagualtion Clinic.  The target INR is 2.0-3.0.  The next INR is due 06/29/2009.  Results were reviewed/authorized by Eda Keys, PharmD.  She was notified by Eda Keys.         Prior Anticoagulation Instructions: INR 2.9  Continue taking 1 tablet (2.5mg ) daily except 2 tablets (5mg ) on Mondays, Wednesdays, and Fridays. Recheck 2/1.  Current Anticoagulation Instructions: INR 3.1  Then start new dosing schedule of 1 tablet (2.5 mg) every day, except take 2 tablets (5 mg) on Monday and Friday. Return to clinic in 1 week.

## 2010-06-21 NOTE — Progress Notes (Signed)
Summary: status update  Phone Note Outgoing Call   Summary of Call: Please call patient to see how she is doing since she had her surgery in July.  She should be scheduled to be seen in the office for follow up.  If she is unable to come to [facility], then we can help arrange for her to be seen by someone at [facility] (Dr. Beverely Low).   Initial call taken by: Lemont Fillers FNP,  January 31, 2010 2:44 PM  Follow-up for Phone Call        Left message on machine to return my call. Nicki Guadalajara Fergerson CMA Duncan Dull)  February 01, 2010 9:16 AM   Additional Follow-up for Phone Call Additional follow up Details #1::        Pt states she is doing fine. Cardiologist doesn't want to see her for 1 year.  Pt states she will have to arrange transportation as she doesn't drive and will call us back for appt.  Nicki Guadalajara Fergerson CMA Duncan Dull)  February 02, 2010 3:14 PM

## 2010-06-21 NOTE — Progress Notes (Signed)
Summary: pt taken to [facility] by her daughter  Phone Note Other Incoming Call back at [phone]   Caller: Home Health Nurse - Benetta Spar Details for Reason: patient experiencing shortness of breath and protuding rectum Summary of Call: [facility] Nurse - Benetta Spar - called to report that patient's BP is low and her rectum is protruding.  Nurse stated that patient not completely coherent and is short of breath, negative for chest pain.  Advised her that physician was not available until 1pm and advised that based on her reportings that this patient should be transported to hospital.  Nurse advised patient and patient was agreeable but asked her to call her daughter in [place] for transportation vs. ambulance.  I advised nurse to attempt contact to daughter but if she is not able to reach her immediately to contact 911.  I also advised her that FNP will contact her to follow-up on the situation when she arrives at office around 1pm Initial call taken by: Fabienne Bruns,  July 20, 2009 12:22 PM  Follow-up for Phone Call        Phone call completed - home health nurse, Benetta Spar, contacted and advised that the patients daughter took patient to [facility] hospital.  Advised that we would not be able to call ahead for admission. Fabienne Bruns  July 20, 2009 2:52 PM  Follow-up by: Fabienne Bruns,  July 20, 2009 2:52 PM

## 2010-06-21 NOTE — Miscellaneous (Signed)
Summary: PT Order/[facility]  PT Order/[facility]   Imported By: Lanelle Bal 08/26/2009 09:43:21  _____________________________________________________________________  External Attachment:    Type:   Image     Comment:   External Document

## 2010-06-21 NOTE — Letter (Signed)
Summary: [facility] Physician Verbal Orders   [facility] Physician Verbal Orders   Imported By: Roderic Ovens 03/09/2010 16:48:34  _____________________________________________________________________  External Attachment:    Type:   Image     Comment:   External Document

## 2010-06-21 NOTE — Miscellaneous (Signed)
Summary: Care Plan/[facility]  Care Plan/[facility]   Imported By: Lanelle Bal 09/08/2009 08:01:44  _____________________________________________________________________  External Attachment:    Type:   Image     Comment:   External Document

## 2010-06-21 NOTE — Medication Information (Signed)
Summary: Coumadin Clinic  Anticoagulant Therapy  Managed by: Weston Brass, PharmD Referring MD: Dietrich Pates, MD PCP: Tonie Griffith, MD Supervising MD: Graciela Husbands MD, Viviann Spare Indication 1: Atrial Fibrillation Lab Used: [facility] [place] Site: [address] PT 56.2 INR POC 5.7 INR RANGE 2-3  Dietary changes: no    Health status changes: yes       Details: having low BP today- 70/54.  Dr. Tenny Craw contacted   Bleeding/hemorrhagic complications: yes       Details: still having bright red blood from rectum- occurs every time she goes to the restroom and is sometimes in her brief.  Has been occuring for over a week.  Recent/future hospitalizations: no    Any changes in medication regimen? yes       Details: stoped lisinopril yesterday secondary to low BP  Recent/future dental: no  Any missed doses?: no       Is patient compliant with meds? yes      Comments: Pt has already taken dose of Coumadin today.  Spoke with Annice Pih, Dr. Charlott Rakes RN.  Her blood pressure was low yesterday in the office as well.  Given her BP and that she is actively bleeding, she is going to contact Dr. Tenny Craw and recommend pt go to the ER for evaluation.   Allergies: No Known Drug Allergies  Anticoagulation Management History:      Her anticoagulation is being managed by telephone today.  Positive risk factors for bleeding include an age of 75 years or older and presence of serious comorbidities.  The bleeding index is 'intermediate risk'.  Positive CHADS2 values include History of CHF and Age > 75 years old.  Prothrombin time is 56.2.  Anticoagulation responsible provider: Graciela Husbands MD, Viviann Spare.  INR POC: 5.7.  Exp: 08/2010.    Anticoagulation Management Assessment/Plan:      The patient's current anticoagulation dose is Warfarin sodium 2.5 mg tabs: Use as directed by Anticoagualtion Clinic.  The target INR is 2.0-3.0.  The next INR is due 09/13/2009.  Anticoagulation instructions were given to home  health nurse/[facility].  Results were reviewed/authorized by Weston Brass, PharmD.  She was notified by Weston Brass PharmD.         Prior Anticoagulation Instructions: INR 3.2  Spoke with [place] [facility] RN while at pt's home.  Advised to have pt take 1/2 tablet tomorrow, then start taking 2.5mg  daily.  Recheck in 1 week.    Current Anticoagulation Instructions: INR 5.7  Hold Coumadin Saturday and Sunday then recheck INR on Monday.  Orders given to Delice Bison with [facility] while in pt's home.

## 2010-06-21 NOTE — Progress Notes (Signed)
Summary: CLARIFICATION OF MED LIST SEEN TODAY  Phone Note Call from Patient Call back at [phone]   Caller: Other Relative CRAIG [phone] Summary of Call: WAS HERE THIS AM;  PT INSTRUCTION CURRENT MEDICATION LIST DID NOT INCLUDE KLOR CON, CALLING FOR CLARIFICATION Initial call taken by: Kandice Hams,  June 22, 2009 1:05 PM  Follow-up for Phone Call        spoke w/ Tasia Catchings this med was dosed by cardiology but wasn't on medlist added to medlist says patient is taking once a day not twice as listed on hospital discharge summary...............Marland KitchenDoristine Devoid  June 22, 2009 1:58 PM      New/Updated Medications: KLOR-CON M20 20 MEQ CR-TABS (POTASSIUM CHLORIDE CRYS CR) take one tablet daily

## 2010-06-21 NOTE — Medication Information (Signed)
Summary: Coumadin Clinic  Anticoagulant Therapy  Managed by: Bethena Midget, RN, BSN Referring MD: Dietrich Pates, MD PCP: Tonie Griffith, MD Supervising MD: Ladona Ridgel MD, Sharlot Gowda Indication 1: Atrial Fibrillation Lab Used: [facility] [place] Site: [address] INR POC 1.5 INR RANGE 2-3  Dietary changes: no    Health status changes: no    Bleeding/hemorrhagic complications: no    Recent/future hospitalizations: yes       Details: [facility] from 3/1 to 3/3   Any changes in medication regimen? no    Recent/future dental: no  Any missed doses?: no       Is patient compliant with meds? yes      Comments: Pt. was in hospital from 3/1 to 3/3 she states due to her blood pressure dropping. States that all her meds were held while in hosp. She states she restarted her warfarin on Friday at 2.5mg s daily.   Allergies: No Known Drug Allergies  Anticoagulation Management History:      Her anticoagulation is being managed by telephone today.  Positive risk factors for bleeding include an age of 75 years or older.  The bleeding index is 'intermediate risk'.  Positive CHADS2 values include History of CHF and Age > 51 years old.  Anticoagulation responsible provider: Ladona Ridgel MD, Sharlot Gowda.  INR POC: 1.5.    Anticoagulation Management Assessment/Plan:      The patient's current anticoagulation dose is Warfarin sodium 2.5 mg tabs: Use as directed by Anticoagualtion Clinic.  The target INR is 2.0-3.0.  The next INR is due 07/30/2009.  Anticoagulation instructions were given to patient/[place].  Results were reviewed/authorized by Bethena Midget, RN, BSN.  She was notified by Bethena Midget, RN, BSN.         Prior Anticoagulation Instructions: INR 3.6 LMOM to hold coumadin today. Call back asap for further dose instruction. Bethena Midget, RN, BSN  July 20, 2009 3:09 PM [facility] to call for dosing. Bethena Midget, RN, BSN  July 21, 2009 11:40 AM Pt. admitted to hospital 07/20/09, warfarin  on hold at present. Bethena Midget, RN, BSN  July 21, 2009 4:30 PM    Current Anticoagulation Instructions: INR 1.5 Change dose to 2.5mg s daily.  Recheck on Friday. Orders given to [place] with [facility] with dose and to recheck in 4 days.

## 2010-06-21 NOTE — Medication Information (Signed)
Summary: Coumadin Clinic  Anticoagulant Therapy  Managed by: Weston Brass, PharmD Referring MD: Dietrich Pates, MD PCP: Tonie Griffith, MD Supervising MD: Shirlee Latch MD, Rosaleigh Brazzel Indication 1: Atrial Fibrillation Lab Used: [facility] [place] Site: [address] INR POC 1.8 INR RANGE 2-3  Dietary changes: no    Health status changes: no    Bleeding/hemorrhagic complications: no    Recent/future hospitalizations: no    Any changes in medication regimen? no    Recent/future dental: no  Any missed doses?: no       Is patient compliant with meds? yes       Allergies: No Known Drug Allergies  Anticoagulation Management History:      Her anticoagulation is being managed by telephone today.  Positive risk factors for bleeding include an age of 75 years or older and presence of serious comorbidities.  The bleeding index is 'intermediate risk'.  Positive CHADS2 values include History of CHF and Age > 70 years old.  Anticoagulation responsible provider: Shirlee Latch MD, Brinnley Lacap.  INR POC: 1.8.  Exp: 08/2010.    Anticoagulation Management Assessment/Plan:      The patient's current anticoagulation dose is Warfarin sodium 2.5 mg tabs: Use as directed by Anticoagualtion Clinic.  The target INR is 2.0-3.0.  The next INR is due 12/15/2009.  Anticoagulation instructions were given to home health nurse/[facility].  Results were reviewed/authorized by Weston Brass, PharmD.  She was notified by Weston Brass PharmD.         Prior Anticoagulation Instructions: INR 1.3 Today 3.75mg s and Friday, then 2.5mg s daily. Recheck in one week. Orders given to Delice Bison with [facility] while at home with Pt.   Current Anticoagulation Instructions: INR 1.8  Spoke with Delice Bison with [facility] while in pt's home.  Increase dose to 1 tablet every day except 1 1/2 tablets on Thursday.  Recheck INR in 1 week.

## 2010-06-21 NOTE — Medication Information (Signed)
Summary: Coumadin Clinic  Anticoagulant Therapy  Managed by: Weston Brass, PharmD Referring MD: Dietrich Pates, MD PCP: Tonie Griffith, MD Supervising MD: Myrtis Ser MD, Tinnie Gens Indication 1: Atrial Fibrillation Lab Used: [facility] [place] Site: [address] PT 14.5 INR POC 1.5 INR RANGE 2-3  Dietary changes: no    Health status changes: no    Bleeding/hemorrhagic complications: no    Recent/future hospitalizations: no    Any changes in medication regimen? no    Recent/future dental: no  Any missed doses?: no       Is patient compliant with meds? yes       Allergies: No Known Drug Allergies  Anticoagulation Management History:      Her anticoagulation is being managed by telephone today.  Positive risk factors for bleeding include an age of 75 years or older and presence of serious comorbidities.  The bleeding index is 'intermediate risk'.  Positive CHADS2 values include History of CHF and Age > 69 years old.  Prothrombin time is 14.5.  Anticoagulation responsible provider: Myrtis Ser MD, Tinnie Gens.  INR POC: 1.5.  Exp: 08/2010.    Anticoagulation Management Assessment/Plan:      The patient's current anticoagulation dose is Warfarin sodium 2.5 mg tabs: Use as directed by Anticoagualtion Clinic.  The target INR is 2.0-3.0.  The next INR is due 02/09/2010.  Anticoagulation instructions were given to home health nurse/[facility].  Results were reviewed/authorized by Weston Brass, PharmD.  She was notified by Weston Brass PharmD.         Prior Anticoagulation Instructions: INR 2.5 Continue 2.5mg s daily except 3.75mg  on Thursdays. REcheck in 4 weeks. Orders given to [facility] nurse while at home with pt.   Current Anticoagulation Instructions: INR 1.5  Spoke with Delice Bison, [facility] nurse while in pt's home.  Take 2 tablets today and tomorrow then resume same dose of 1 tablet every day except 1 1/2 tablets on Thursday.  Recheck INR in 1 week.

## 2010-06-21 NOTE — Medication Information (Signed)
Summary: Coumadin Clinic  Anticoagulant Therapy  Managed by: Leota Sauers, PharmD, BCPS, CPP Referring MD: Dietrich Pates, MD PCP: Tonie Griffith, MD Supervising MD: Shirlee Latch MD, Florestine Carmical Indication 1: Atrial Fibrillation Lab Used: [facility] [place] Site: [address] INR POC 1.9 INR RANGE 2-3  Dietary changes: no    Health status changes: no    Bleeding/hemorrhagic complications: no    Recent/future hospitalizations: no    Any changes in medication regimen? no    Recent/future dental: no  Any missed doses?: no       Is patient compliant with meds? yes       Current Medications (verified): 1)  Carvedilol 6.25 Mg Tabs (Carvedilol) .... Take One Tablet By Mouth Twice A Day 2)  Lisinopril 10 Mg Tabs (Lisinopril) .... Take One Tablet By Mouth Daily 3)  Furosemide 40 Mg Tabs (Furosemide) .... Take One Tablet By Mouth Daily. 4)  Warfarin Sodium 2.5 Mg Tabs (Warfarin Sodium) .... Use As Directed By Anticoagualtion Clinic 5)  Multivitamins  Tabs (Multiple Vitamin) 6)  Vitamin D 1000 Unit Tabs (Cholecalciferol) 7)  Klor-Con M20 20 Meq Cr-Tabs (Potassium Chloride Crys Cr) .... Take One Tablet Daily 8)  Acetaminphen .... As Needed  Allergies (verified): No Known Drug Allergies  Anticoagulation Management History:      Her anticoagulation is being managed by telephone today.  Positive risk factors for bleeding include an age of 12 years or older.  The bleeding index is 'intermediate risk'.  Positive CHADS2 values include History of CHF and Age > 57 years old.  Anticoagulation responsible provider: Shirlee Latch MD, Taryne Kiger.  INR POC: 1.9.  Exp: 08/2010.    Anticoagulation Management Assessment/Plan:      The patient's current anticoagulation dose is Warfarin sodium 2.5 mg tabs: Use as directed by Anticoagualtion Clinic.  The target INR is 2.0-3.0.  The next INR is due 08/04/2009.  Anticoagulation instructions were given to patient/[facility].  Results were  reviewed/authorized by Leota Sauers, PharmD, BCPS, CPP.         Prior Anticoagulation Instructions: INR 1.6  Take 2 tabs for 2 days, then resume 1 tab daily.  Recheck in 5 - 6 days.   Orders to [place], Charity fundraiser at 1250.    Current Anticoagulation Instructions: Continue 1 tab = 2.5mg  each day  recheck 1 week

## 2010-06-21 NOTE — Progress Notes (Signed)
  Phone Note Outgoing Call   Summary of Call: Patient was recently discharged from hospital.  Please call pt and arrange a follow up visit.  She ususually sees me at [facility]- but is welcome to come to [facility]. Initial call taken by: Lemont Fillers FNP,  July 27, 2009 3:30 PM  Follow-up for Phone Call        pt. made a appointment for Friday 07/30/09 at 10:00 at [facility] office for a Hospital F/u, but is not sure if she will be able to find a ride. Follow-up by: Michaelle Copas,  July 27, 2009 3:55 PM

## 2010-06-21 NOTE — Progress Notes (Signed)
Summary: [facility] CALLING WITH  PT INR RESULTS  Phone Note Other Incoming   Caller: [phone] [place] Summary of Call: [facility] CALLING WITH PTINR RESULTS INR 3.2 PT 32.4 Initial call taken by: Judie Grieve,  June 29, 2009 4:03 PM  Follow-up for Phone Call        INR noted, see coumadin note in EMR. Follow-up by: Cloyde Reams, RN, BSN,  June 29, 2009 4:14 PM

## 2010-06-21 NOTE — Miscellaneous (Signed)
Summary: PT & SN Orders/[facility]  PT & SN Orders/[facility]   Imported By: Lanelle Bal 08/19/2009 13:04:37  _____________________________________________________________________  External Attachment:    Type:   Image     Comment:   External Document

## 2010-06-21 NOTE — Miscellaneous (Signed)
Summary: Med Order/[facility]  Med Order/[facility]   Imported By: Lanelle Bal 09/27/2009 12:22:11  _____________________________________________________________________  External Attachment:    Type:   Image     Comment:   External Document

## 2010-06-21 NOTE — Medication Information (Signed)
Summary: Coumadin Clinic  Anticoagulant Therapy  Managed by: Bethena Midget, RN, BSN Referring MD: Dietrich Pates, MD PCP: Tonie Griffith, MD Supervising MD: Eden Emms MD, Theron Arista Indication 1: Atrial Fibrillation Lab Used: [facility] [place] Site: [address] PT 20.5 INR POC 2.1 INR RANGE 2-3  Dietary changes: no     Bleeding/hemorrhagic complications: yes       Details: Rectal Bleeding continues     Any missed doses?: no       Is patient compliant with meds? yes       Allergies: No Known Drug Allergies  Anticoagulation Management History:      Her anticoagulation is being managed by telephone today.  Positive risk factors for bleeding include an age of 75 years or older and presence of serious comorbidities.  The bleeding index is 'intermediate risk'.  Positive CHADS2 values include History of CHF and Age > 18 years old.  Prothrombin time is 20.5.  Anticoagulation responsible provider: Eden Emms MD, Theron Arista.  INR POC: 2.1.    Anticoagulation Management Assessment/Plan:      The patient's current anticoagulation dose is Warfarin sodium 2.5 mg tabs: Use as directed by Anticoagualtion Clinic.  The target INR is 2.0-3.0.  The next INR is due 09/27/2009.  Anticoagulation instructions were given to home health nurse/[facility].  Results were reviewed/authorized by Bethena Midget, RN, BSN.  She was notified by Bethena Midget, RN, BSN.         Prior Anticoagulation Instructions: INR 2.0 Continue 2.5mg  daily. Orders given to Delice Bison with [facility] while at home with pt.   Current Anticoagulation Instructions: INR 2.1  Appended Document: Coumadin Clinic Orders given to Delice Bison with [facility] to recheck INR on 10/08/09.

## 2010-06-21 NOTE — Progress Notes (Signed)
Summary: B/P LOW 70/54  Phone Note From Other Clinic   Caller: [facility]/ TARA [phone] Summary of Call: [facility] CALLING REGARDING THE PT B/P 70/54 Initial call taken by: Judie Grieve,  September 03, 2009 9:45 AM  Follow-up for Phone Call        [facility] nurse Delice Bison back...she states that the  patient is more weak today and has advised her that she has had rectal bleeding (bright red) for the past week. BP remains low at 70/54 despite holding Lisinopril. Advised her to get patient to the ER. LM with Dr.Koston Hennes and cardmaster notified.

## 2010-06-21 NOTE — Letter (Signed)
Summary: Dept of Health & Human Services   [facility]pt [facility] Health & Human Services   Imported By: Marylou Layken Beg 02/21/2010 14:33:09  _____________________________________________________________________  External Attachment:    Type:   Image     Comment:   External Document

## 2010-06-21 NOTE — Progress Notes (Signed)
Summary: Nuc. Pre-Procedure  Phone Note Outgoing Call Call back at [facility] Phone [phone] Call back at [phone]   Call placed by: Irean Hong, RN,  September 13, 2009 2:12 PM Summary of Call: Reviewed information on Myoview Information Sheet (see scanned document for further details).  Spoke with patient's daughter.     Nuclear Med Background Indications for Stress Test: Evaluation for Ischemia, Surgical Clearance  Indications Comments: Pending surgical repair of rectal prolapse  History: COPD, Echo, Emphysema  History Comments: 1/11 Echo: EF=45%. Hx. AFIB, CHF, Mild LVD, Pulmonary HTN.   Symptoms Comments: Recent hypotension, corrected in ED with normal saline.   Nuclear Pre-Procedure Height (in): 61  Nuclear Med Study Referring MD:  Dietrich Pates, MD

## 2010-06-21 NOTE — Assessment & Plan Note (Signed)
Summary: per check out/sf   Visit Type:  Follow-up Primary Provider:  Tonie Griffith, MD  CC:  sob with exer-and loose stools.  History of Present Illness: patient is a 77 year old with a history of atrial fibrillation, CHF (mild LV dysfunction by echo), pulmonary hypertension.  I last saw her in February.  Since seen, she denies palpitations.  Breathing has been ok.  She does note some dizziness today.  Her by mouth intake has been down. She continues to undergo evaluation for rectal prolapse.  She is being evaluated for surgical repair.  Current Medications (verified): 1)  Carvedilol 6.25 Mg Tabs (Carvedilol) .... Take One Tablet By Mouth Twice A Day 2)  Lisinopril 10 Mg Tabs (Lisinopril) .... Take One Tablet By Mouth Daily 3)  Furosemide 40 Mg Tabs (Furosemide) .... Take One Tablet By Mouth Daily. 4)  Warfarin Sodium 2.5 Mg Tabs (Warfarin Sodium) .... Use As Directed By Anticoagualtion Clinic 5)  Multivitamins  Tabs (Multiple Vitamin) 6)  Vitamin D 1000 Unit Tabs (Cholecalciferol) 7)  Klor-Con M20 20 Meq Cr-Tabs (Potassium Chloride Crys Cr) .... Take One Tablet Daily 8)  Acetaminphen .... As Needed  Allergies (verified): No Known Drug Allergies  Past History:  Past medical, surgical, family and social histories (including risk factors) reviewed, and no changes noted (except as noted below).  Past Medical History: Reviewed history from 06/25/2009 and no changes required. Arthritis Chicken Pox Atrial fibrillation CHF Rectal prolapse  Past Surgical History: Reviewed history from 07/05/2009 and no changes required. Hip Replacement-1988 Bladder Tack Arthroscopic surgery x2  Family History: Reviewed history from 06/07/2009 and no changes required. CAD-no HTN-no DM-no COLON CA-no BREAST CA-no STROKE-father-subarachnoid hemorrhage BRAIN CA-mother and brother  Social History: Reviewed history from 06/07/2009 and no changes required. 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 HEENT:  Normocephalic, atraumatic. EOMI, PERRLA.  Neck: JVP is normal. No thyromegaly. No bruits.  Lungs: clear to auscultation. No rales no wheezes.  Heart: Irregular rate and rhythm. Normal S1, S2. No S3.   No significant murmurs. PMI not displaced.  Abdomen:  Supple, nontender. Normal bowel sounds. No masses. No hepatomegaly.  Extremities:   Good distal pulses throughout. No lower extremity edema.  Musculoskeletal :moving all extremities.  Neuro:   alert and oriented x3.    EKG  Procedure date:  09/02/2009  Findings:      Atrial fibrillation.  82 bpm.  Occasional PVC  Septal MI.  [place]  Impression & Recommendations:  Problem # 1:  ATRIAL FIBRILLATION WITH RAPID VENTRICULAR RESPONSE (ICD-427.31) Continue on rate control.  and coumadin.  Problem # 2:  CHF (ICD-428.0) Mild LV dysfunction.  BP is very low today.  Per the patient her by mouth intake has not been good. I would stop Lisinopril.  Hold lasix.   hte pateint says she will increase her by mouth intake I have encouraged her to keep family memebers with her since her bp is so low.  She has a neighbor who has helped her.  She refuses to have  anyone spend night.   She says she is ok to go home.  Will check labs and f/u tonight/tomorrow.  WIth planned surgery, I would schedule an adenosine myoview to evaluate for inducible ischemia.  Other Orders: EKG w/ Interpretation (93000) Nuclear Stress Test (Nuc Stress Test) TLB-BNP (B-Natriuretic Peptide) (83880-BNPR) TLB-BMP (Basic Metabolic Panel-BMET) (80048-METABOL) TLB-CBC Platelet - w/Differential  (85025-CBCD)  Patient Instructions: 1)  Your physician recommends that you return for lab work in:lab work today .Marland KitchenMarland KitchenMarland Kitchenwe will call you with results 2)  Your physician has requested that you have an adenosine myoview.  For further information please visit [email or web address]  Please follow instruction sheet, as given. we will call you with results

## 2010-06-21 NOTE — Medication Information (Signed)
Summary: Coumadin Clinic  Anticoagulant Therapy  Managed by: Weston Brass, PharmD Referring MD: Dietrich Pates, MD PCP: Tonie Griffith, MD Supervising MD: Graciela Husbands MD, Viviann Spare Indication 1: Atrial Fibrillation Lab Used: [facility] [place] Site: [address] PT 21.3 INR POC 2.1 INR RANGE 2-3  Dietary changes: no    Health status changes: no    Bleeding/hemorrhagic complications: no    Recent/future hospitalizations: no    Any changes in medication regimen? no    Recent/future dental: no  Any missed doses?: no       Is patient compliant with meds? yes       Allergies: No Known Drug Allergies  Anticoagulation Management History:      Her anticoagulation is being managed by telephone today.  Positive risk factors for bleeding include an age of 75 years or older and presence of serious comorbidities.  The bleeding index is 'intermediate risk'.  Positive CHADS2 values include History of CHF and Age > 51 years old.  Prothrombin time is 21.3.  Anticoagulation responsible Sommer Spickard: Graciela Husbands MD, Viviann Spare.  INR POC: 2.1.  Exp: 08/2010.    Anticoagulation Management Assessment/Plan:      The patient's current anticoagulation dose is Warfarin sodium 2.5 mg tabs: Use as directed by Anticoagualtion Clinic.  The target INR is 2.0-3.0.  The next INR is due 03/30/2010.  Anticoagulation instructions were given to patient/[facility] RN.  Results were reviewed/authorized by Weston Brass, PharmD.  She was notified by Weston Brass PharmD.         Prior Anticoagulation Instructions: INR 2.2  Called spoke with pt, advised to continue on same dosage 1 tablet daily except 1.5 tablets on Mondays and Thursdays.  Called [facility] spoke with Waynetta Sandy, RN advised to redraw PT/INR on 03/09/10  Current Anticoagulation Instructions: INR 2.1  Spoke with pt.  Continue same dose of 1 tablet every day except 1 1/2 tablets on Monday and Thursday. Recheck INR in 3 weeks.  LMOM for Delice Bison with [facility] with  orders.

## 2010-06-21 NOTE — Miscellaneous (Signed)
Summary: Home Care Visit Order/[facility]  Home Care Visit Order/[facility]   Imported By: Lanelle Bal 08/13/2009 10:28:21  _____________________________________________________________________  External Attachment:    Type:   Image     Comment:   External Document

## 2010-06-21 NOTE — Medication Information (Signed)
Summary: ccr/per aft hrs sheet/jss  Anticoagulant Therapy  Managed by: Cloyde Reams, RN Referring MD: Dietrich Pates, MD PCP: Dr. Brock Ra MD: Juanda Chance MD, Rella Egelston Indication 1: Atrial Fibrillation INR POC 2.9 INR RANGE 2-3  Dietary changes: no    Health status changes: no    Bleeding/hemorrhagic complications: no    Recent/future hospitalizations: no    Any changes in medication regimen? no    Recent/future dental: no  Any missed doses?: no       Is patient compliant with meds? yes      Comments: Pt. D/C'd Sunday 1/23.   Current Medications (verified): 1)  Carvedilol 6.25 Mg Tabs (Carvedilol) .... Take One Tablet By Mouth Twice A Day 2)  Lisinopril 10 Mg Tabs (Lisinopril) .... Take One Tablet By Mouth Daily 3)  Furosemide 40 Mg Tabs (Furosemide) .... Take One Tablet By Mouth Daily. 4)  Warfarin Sodium 2.5 Mg Tabs (Warfarin Sodium) .... Use As Directed By Anticoagualtion Clinic 5)  Multivitamins  Tabs (Multiple Vitamin) 6)  Vitamin D 1000 Unit Tabs (Cholecalciferol)  Allergies: No Known Drug Allergies  Anticoagulation Management History:      The patient comes in today for her initial visit for anticoagulation therapy.  Positive risk factors for bleeding include an age of 75 years or older.  The bleeding index is 'intermediate risk'.  Positive CHADS2 values include History of CHF and Age > 75 years old.  Anticoagulation responsible provider: Juanda Chance MD, Smitty Cords.  INR POC: 2.9.  Cuvette Lot#: [id number].  Exp: 08/2010.    Anticoagulation Management Assessment/Plan:      The patient's current anticoagulation dose is Warfarin sodium 2.5 mg tabs: Use as directed by Anticoagualtion Clinic.  The target INR is 2.0-3.0.  The next INR is due 06/22/2009.  Results were reviewed/authorized by Cloyde Reams, RN.  She was notified by Lew Dawes, PharmD Candidate.         Current Anticoagulation Instructions: INR 2.9  Continue taking 1 tablet (2.5mg ) daily except 2 tablets (5mg )  on Mondays, Wednesdays, and Fridays. Recheck 2/1.

## 2010-06-21 NOTE — Medication Information (Signed)
Summary: rov/sp  Anticoagulant Therapy  Managed by: Weston Brass, PharmD Referring MD: Dietrich Pates, MD PCP: Tonie Griffith, MD Supervising MD: Myrtis Ser MD, Tinnie Gens Indication 1: Atrial Fibrillation Lab Used: [facility] [place] Site: [address] INR POC 1.6 INR RANGE 2-3  Dietary changes: no    Health status changes: no    Bleeding/hemorrhagic complications: no    Recent/future hospitalizations: no    Any changes in medication regimen? no    Recent/future dental: no  Any missed doses?: no       Is patient compliant with meds? yes       Allergies: No Known Drug Allergies  Anticoagulation Management History:      The patient is taking warfarin and comes in today for a routine follow up visit.  Positive risk factors for bleeding include an age of 81 years or older and presence of serious comorbidities.  The bleeding index is 'intermediate risk'.  Positive CHADS2 values include History of CHF and Age > 61 years old.  Anticoagulation responsible provider: Myrtis Ser MD, Tinnie Gens.  INR POC: 1.6.  Cuvette Lot#: [id number].  Exp: 08/2010.    Anticoagulation Management Assessment/Plan:      The patient's current anticoagulation dose is Warfarin sodium 2.5 mg tabs: Use as directed by Anticoagualtion Clinic.  The target INR is 2.0-3.0.  The next INR is due 04/20/2010.  Anticoagulation instructions were given to patient/[facility] RN.  Results were reviewed/authorized by Weston Brass, PharmD.  She was notified by Weston Brass PharmD.         Prior Anticoagulation Instructions: INR 2.1  Spoke with pt.  Continue same dose of 1 tablet every day except 1 1/2 tablets on Monday and Thursday. Recheck INR in 3 weeks.  LMOM for Delice Bison with [facility] with orders.   Current Anticoagulation Instructions: INR 1.6  Take an extra 1 tablet today then resume same dose of 1 tablet every day except 1 1/2 tablets on Monday and Thursday.  Recheck INR in 2 weeks.

## 2010-06-21 NOTE — Letter (Signed)
Summary: [facility]   Imported By: Lester Coleridge 09/09/2009 09:44:56  _____________________________________________________________________  External Attachment:    Type:   Image     Comment:   External Document

## 2010-06-21 NOTE — Medication Information (Signed)
Summary: Coumadin Clinic  Anticoagulant Therapy  Managed by: Cloyde Reams, RN, BSN Referring MD: Dietrich Pates, MD PCP: Dr. Brock Ra MD: Riley Kill MD, Maisie Fus Indication 1: Atrial Fibrillation PT 32.4 INR POC 3.2 INR RANGE 2-3    Bleeding/hemorrhagic complications: yes       Details: Rectal bleeding resolved per pt report.   Any changes in medication regimen? no     Any missed doses?: no         Allergies: No Known Drug Allergies  Anticoagulation Management History:      Her anticoagulation is being managed by telephone today.  Positive risk factors for bleeding include an age of 75 years or older.  The bleeding index is 'intermediate risk'.  Positive CHADS2 values include History of CHF and Age > 28 years old.  Prothrombin time is 32.4.  Anticoagulation responsible provider: Riley Kill MD, Maisie Fus.  INR POC: 3.2.  Exp: 08/2010.    Anticoagulation Management Assessment/Plan:      The patient's current anticoagulation dose is Warfarin sodium 2.5 mg tabs: Use as directed by Anticoagualtion Clinic.  The target INR is 2.0-3.0.  The next INR is due 07/06/2009.  Anticoagulation instructions were given to patient.  Results were reviewed/authorized by Cloyde Reams, RN, BSN.  She was notified by Cloyde Reams RN.         Prior Anticoagulation Instructions: INR 3.1  Then start new dosing schedule of 1 tablet (2.5 mg) every day, except take 2 tablets (5 mg) on Monday and Friday. Return to clinic in 1 week.    Current Anticoagulation Instructions: INR 3.2  Called spoke with pt.  Advised to take 1/2 tablet tomorrow then start taking 1 tablet daily except 2 tablets on Mondays.  Recheck in 1 week.  Called spoke with [place], RN [facility] advised of dosage instructions and gave verbal order to recheck PT/INR in 1 week on 07/06/09.

## 2010-06-21 NOTE — Miscellaneous (Signed)
Summary: SN Orders/[facility]  SN Orders/[facility]   Imported By: Lanelle Bal 07/22/2009 13:03:23  _____________________________________________________________________  External Attachment:    Type:   Image     Comment:   External Document

## 2010-06-21 NOTE — Medication Information (Signed)
Summary: Coumadin Clinic  Anticoagulant Therapy  Managed by: Cloyde Reams, RN, BSN Referring MD: Dietrich Pates, MD PCP: Tonie Griffith, MD Supervising MD: Daleen Squibb MD, Maisie Fus Indication 1: Atrial Fibrillation Lab Used: [facility] [place] Site: [address] INR POC 3.2 INR RANGE 2-3    Bleeding/hemorrhagic complications: yes       Details: BRB from rectum, hx rectal prolapse.   Any changes in medication regimen? no     Any missed doses?: no       Is patient compliant with meds? yes       Allergies: No Known Drug Allergies  Anticoagulation Management History:      Her anticoagulation is being managed by telephone today.  Positive risk factors for bleeding include an age of 43 years or older.  The bleeding index is 'intermediate risk'.  Positive CHADS2 values include History of CHF and Age > 80 years old.  Anticoagulation responsible provider: Daleen Squibb MD, Maisie Fus.  INR POC: 3.2.  Exp: 08/2010.    Anticoagulation Management Assessment/Plan:      The patient's current anticoagulation dose is Warfarin sodium 2.5 mg tabs: Use as directed by Anticoagualtion Clinic.  The target INR is 2.0-3.0.  The next INR is due 09/03/2009.  Anticoagulation instructions were given to home health nurse/[facility].  Results were reviewed/authorized by Cloyde Reams, RN, BSN.  She was notified by Cloyde Reams RN.         Prior Anticoagulation Instructions: INR 1.7  Called spoke with pt.  Advised to take 1.5 tablets on Saturday and 1 tablet all other days.  Will recheck in 1 week.  Called [facility] advised to recheck PT/INR in 1 week on 08/27/09.  Current Anticoagulation Instructions: INR 3.2  Spoke with [place] [facility] RN while at pt's home.  Advised to have pt take 1/2 tablet tomorrow, then start taking 2.5mg  daily.  Recheck in 1 week.

## 2010-06-21 NOTE — Medication Information (Signed)
Summary: Coumadin Clinic  Anticoagulant Therapy  Managed by: Cloyde Reams, RN, BSN Referring MD: Dietrich Pates, MD PCP: Tonie Griffith, MD Supervising MD: Johney Frame MD, Fayrene Fearing Indication 1: Atrial Fibrillation Lab Used: [facility] [place] Site: [address] PT 21.8 INR POC 2.2 INR RANGE 2-3    Bleeding/hemorrhagic complications: no     Any changes in medication regimen? no     Any missed doses?: no       Is patient compliant with meds? yes       Allergies: No Known Drug Allergies  Anticoagulation Management History:      Her anticoagulation is being managed by telephone today.  Positive risk factors for bleeding include an age of 75 years or older and presence of serious comorbidities.  The bleeding index is 'intermediate risk'.  Positive CHADS2 values include History of CHF and Age > 75 years old.  Prothrombin time is 21.8.  Anticoagulation responsible Jeaneen Cala: Allred MD, Fayrene Fearing.  INR POC: 2.2.  Exp: 08/2010.    Anticoagulation Management Assessment/Plan:      The patient's current anticoagulation dose is Warfarin sodium 2.5 mg tabs: Use as directed by Anticoagualtion Clinic.  The target INR is 2.0-3.0.  The next INR is due 03/09/2010.  Anticoagulation instructions were given to patient/[facility] RN.  Results were reviewed/authorized by Cloyde Reams, RN, BSN.  She was notified by Cloyde Reams RN.         Prior Anticoagulation Instructions: INR 1.8  Take extra 1/2 tablet today then increase dose to 1 tablet every day except 1 1/2 tablets on Monday and Thursday.  Recheck INR in 2 weeks.  Orders given to Delice Bison with [facility].   Current Anticoagulation Instructions: INR 2.2  Called spoke with pt, advised to continue on same dosage 1 tablet daily except 1.5 tablets on Mondays and Thursdays.  Called [facility] spoke with Waynetta Sandy, RN advised to redraw PT/INR on 03/09/10

## 2010-06-21 NOTE — Letter (Signed)
Summary: [facility] Cardiac Clearance   [facility] Cardiac Clearance   Imported By: Roderic Ovens 09/16/2009 15:05:30  _____________________________________________________________________  External Attachment:    Type:   Image     Comment:   External Document

## 2010-06-21 NOTE — Progress Notes (Signed)
Summary: PT/INR  Phone Note From Other Clinic Call back at [phone]   Caller: [facility] Summary of Call: PT 36.1  INR 3.6, as of today  Initial call taken by: Migdalia Dk,  July 20, 2009 12:50 PM  Follow-up for Phone Call        See [facility] sheet Follow-up by: Bethena Midget, RN, BSN,  July 20, 2009 3:05 PM

## 2010-06-21 NOTE — Miscellaneous (Signed)
Summary: Home Health Certification Addendum   Home Health Certification Addendum   Imported By: Roderic Ovens 04/05/2010 13:29:55  _____________________________________________________________________  External Attachment:    Type:   Image     Comment:   External Document

## 2010-06-21 NOTE — Medication Information (Signed)
Summary: Coumadin Clinic  Anticoagulant Therapy  Managed by: Cloyde Reams, RN, BSN Referring MD: Dietrich Pates, MD PCP: Tonie Griffith, MD Supervising MD: Daleen Squibb MD, Maisie Fus Indication 1: Atrial Fibrillation Lab Used: [facility] [place] Site: [address] PT 28.6 INR POC 2.9 INR RANGE 2-3           Allergies: No Known Drug Allergies  Anticoagulation Management History:      Her anticoagulation is being managed by telephone today.  Positive risk factors for bleeding include an age of 4 years or older.  The bleeding index is 'intermediate risk'.  Positive CHADS2 values include History of CHF and Age > 10 years old.  Prothrombin time is 28.6.  Anticoagulation responsible provider: Daleen Squibb MD, Maisie Fus.  INR POC: 2.9.  Exp: 08/2010.    Anticoagulation Management Assessment/Plan:      The patient's current anticoagulation dose is Warfarin sodium 2.5 mg tabs: Use as directed by Anticoagualtion Clinic.  The target INR is 2.0-3.0.  The next INR is due 07/20/2009.  Anticoagulation instructions were given to [place]/[facility] RN.  Results were reviewed/authorized by Cloyde Reams, RN, BSN.  She was notified by Cloyde Reams RN.         Prior Anticoagulation Instructions: INR 2.9  Spoke with [place], [facility] RN while at pt's home.  Advised to continue on same dosage 1 tablet daily except 2 tablets on Mondays.  Recheck in 1 week.    Current Anticoagulation Instructions: INR 2.9  Called spoke with pt.  Advised to continue on same dosage 1 tablet daily except 2 tablets on Mondays.  Recheck in 1 week.  Attempted to call [place] with [facility] [phone].  LMOM TCB for redraw orders.  Called spoke with Meriam Sprague at [facility] gave verbal orders to recheck in 1 week. Cloyde Reams RN  July 13, 2009 3:41 PM

## 2010-06-21 NOTE — Medication Information (Signed)
Summary: Coumadin Clinic  Anticoagulant Therapy  Managed by: Cloyde Reams, RN, BSN Referring MD: Dietrich Pates, MD PCP: Tonie Griffith, MD Supervising MD: Daleen Squibb MD, Maisie Fus Indication 1: Atrial Fibrillation Lab Used: [facility] [place] Site: [address] PT 28.9 INR POC 2.9 INR RANGE 2-3    Bleeding/hemorrhagic complications: no     Any changes in medication regimen? no     Any missed doses?: no         Allergies: No Known Drug Allergies  Anticoagulation Management History:      Her anticoagulation is being managed by telephone today.  Positive risk factors for bleeding include an age of 75 years or older.  The bleeding index is 'intermediate risk'.  Positive CHADS2 values include History of CHF and Age > 15 years old.  Prothrombin time is 28.9.  Anticoagulation responsible provider: Daleen Squibb MD, Maisie Fus.  INR POC: 2.9.  Exp: 08/2010.    Anticoagulation Management Assessment/Plan:      The patient's current anticoagulation dose is Warfarin sodium 2.5 mg tabs: Use as directed by Anticoagualtion Clinic.  The target INR is 2.0-3.0.  The next INR is due 08/19/2009.  Anticoagulation instructions were given to home health nurse/[facility].  Results were reviewed/authorized by Cloyde Reams, RN, BSN.  She was notified by Cloyde Reams RN.         Prior Anticoagulation Instructions: Continue 1 tab = 2.5mg  each day  recheck 1 week  Current Anticoagulation Instructions: INR 2.9  Spoke with [place] RN while at pt's home.  Advised to continue on same dosage 2.5mg  daily.  Recheck PT/INR in 1 week.

## 2010-06-21 NOTE — Medication Information (Signed)
Summary: rov/sp  Anticoagulant Therapy  Managed by: Weston Brass, PharmD Referring MD: Dietrich Pates, MD PCP: Tonie Griffith, MD Supervising MD: Gala Romney MD, Reuel Boom Indication 1: Atrial Fibrillation Lab Used: [facility] [place] Site: [address] INR POC 1.6 INR RANGE 2-3  Dietary changes: no    Health status changes: no    Bleeding/hemorrhagic complications: no    Recent/future hospitalizations: no    Any changes in medication regimen? no    Recent/future dental: no  Any missed doses?: no       Is patient compliant with meds? yes      Comments: Recommended pt return in 2 weeks but unable 2/2 to travel will check when pt returns. Pt was educated on risk of high and low INR, diet issues concerning INR and signs of bleed and clot.   Allergies: No Known Drug Allergies  Anticoagulation Management History:      The patient is taking warfarin and comes in today for a routine follow up visit.  Positive risk factors for bleeding include an age of 75 years or older and presence of serious comorbidities.  The bleeding index is 'intermediate risk'.  Positive CHADS2 values include History of CHF and Age > 55 years old.  Anticoagulation responsible provider: Tanazia Achee MD, Reuel Boom.  INR POC: 1.6.  Cuvette Lot#: [id number].  Exp: 04/2011.    Anticoagulation Management Assessment/Plan:      The patient's current anticoagulation dose is Warfarin sodium 2.5 mg tabs: Use as directed by Anticoagualtion Clinic.  The target INR is 2.0-3.0.  The next INR is due 05/09/2010.  Anticoagulation instructions were given to patient/[facility] RN.  Results were reviewed/authorized by Weston Brass, PharmD.  She was notified by Hoy Register, PharmD Candidate.         Prior Anticoagulation Instructions: INR 1.6  Take an extra 1 tablet today then resume same dose of 1 tablet every day except 1 1/2 tablets on Monday and Thursday.  Recheck INR in 2 weeks.   Current Anticoagulation  Instructions: INR 1.6 Take 2 tablets tomorrow then take 1 tablet everyday except 1.5 tablet on Monday, Wednesday, and Friday Recheck INR in 2 weeks

## 2010-06-21 NOTE — Medication Information (Signed)
Summary: Coumadin Clinic  Anticoagulant Therapy  Managed by: Cloyde Reams, RN, BSN Referring MD: Dietrich Pates, MD PCP: Tonie Griffith, MD Supervising MD: Tenny Craw MD, Gunnar Fusi Indication 1: Atrial Fibrillation Lab Used: [facility] [place] Site: [address] PT 17.0 INR POC 1.7 INR RANGE 2-3    Bleeding/hemorrhagic complications: no     Any changes in medication regimen? no     Any missed doses?: no       Is patient compliant with meds? yes       Allergies: No Known Drug Allergies  Anticoagulation Management History:      Her anticoagulation is being managed by telephone today.  Positive risk factors for bleeding include an age of 75 years or older.  The bleeding index is 'intermediate risk'.  Positive CHADS2 values include History of CHF and Age > 65 years old.  Prothrombin time is 17.0.  Anticoagulation responsible provider: Tenny Craw MD, Gunnar Fusi.  INR POC: 1.7.  Exp: 08/2010.    Anticoagulation Management Assessment/Plan:      The patient's current anticoagulation dose is Warfarin sodium 2.5 mg tabs: Use as directed by Anticoagualtion Clinic.  The target INR is 2.0-3.0.  The next INR is due 08/27/2009.  Anticoagulation instructions were given to home health nurse/[facility].  Results were reviewed/authorized by Cloyde Reams, RN, BSN.  She was notified by Cloyde Reams RN.         Prior Anticoagulation Instructions: INR 2.9  Spoke with [place] RN while at pt's home.  Advised to continue on same dosage 2.5mg  daily.  Recheck PT/INR in 1 week.    Current Anticoagulation Instructions: INR 1.7  Called spoke with pt.  Advised to take 1.5 tablets on Saturday and 1 tablet all other days.  Will recheck in 1 week.  Called [facility] advised to recheck PT/INR in 1 week on 08/27/09.

## 2010-06-21 NOTE — Progress Notes (Signed)
Summary: [facility] orders PT/INR  Phone Note Other Incoming   Caller: [facility] Summary of Call: Pt was discharged from hospital 07/22/09, INR 3.01.  Discharged on 2.5mg  daily per hospital d/c.  Advised to have pt take 2.5mg  daily and requested recheck on 07/26/09. Initial call taken by: Cloyde Reams RN,  July 23, 2009 12:36 PM

## 2010-06-21 NOTE — Assessment & Plan Note (Signed)
Summary: discuss follwop from ed/alr   Vital Signs:  Patient profile:   77 year old female Weight:      126 pounds Pulse rate:   62 / minute BP sitting:   114 / 62  (left arm)  Vitals Entered By: Doristine Devoid (June 22, 2009 11:27 AM) CC: f/u saw Dr. Zachery Dakins doesn't do surgery for rectal prolaspe need to know whats next step   Primary Care Provider:  Dr. Peggyann Juba  CC:  f/u saw Dr. Zachery Dakins doesn't do surgery for rectal prolaspe need to know whats next step.  History of Present Illness: Lindsey Ferguson presents today for  follow up of her rectal prolapse.  She was recently hospitalized at [facility] under [facility] for AF with RVR, and acute systolic/diastolic heart failure.  She was started on coumadin and was diuresed.  She now is maintained on BB, ACE, Lasix and coumadin.  Went to coumadin clinic  this AM.  Has a follow up appointment this week with cardiology for post-hospital follow up.    Also saw Dr. Zachery Dakins who does not perform surgery for rectal prolapse. Family is upset because Dr. Zachery Dakins said, "that's not a prolapse."  Dr. Zachery Dakins told her that he does not perform this surgery and patient will have to go to [facility] for repair- however he did not recommend a Careers adviser.  He recommended miralax.  Family wants more information on what can be done- patient is reluctant to have surgery- but remains extremely uncomfortable, "can't sit, can't stand."  This is really impacting her quality of life.    Notes that she had a sigmoidoscopy several years ago.    Allergies: No Known Drug Allergies  Physical Exam  General:  Well-developed,well-nourished,in no acute distress; alert,appropriate and cooperative throughout examination Eyes:  resolution of periorbital edema Rectal:  + rectal prolapse again noted, though not as pronounced today,  + incontinence of stool noted as well.   Extremities:  resolution of lower extremity edema   Impression & Recommendations:  Problem #  1:  RECTAL PROLAPSE (ICD-569.1) Long discussion with the patient, her daughter and her nephew.  They desire a referral to GI to further review her rectal prolapse and have another opinion on weather she would improve with surgery.  She is also likely due for colonoscopy, however at this point with rectal prolapse and now on coumadin, I am sure she is a good candidate for this.  If after further discussion with GI they wish to persue surgical repair of her prolapse, I told them that I would be happy to make a referral to another surgeon.  They seem happy with this plan.  Patient would need cardiac clearance prior to any surgical procedure.   Orders: Gastroenterology Referral (GI)  Complete Medication List: 1)  Carvedilol 6.25 Mg Tabs (Carvedilol) .... Take one tablet by mouth twice a day 2)  Lisinopril 10 Mg Tabs (Lisinopril) .... Take one tablet by mouth daily 3)  Furosemide 40 Mg Tabs (Furosemide) .... Take one tablet by mouth daily. 4)  Warfarin Sodium 2.5 Mg Tabs (Warfarin sodium) .... Use as directed by anticoagualtion clinic 5)  Multivitamins Tabs (Multiple vitamin) 6)  Vitamin D 1000 Unit Tabs (Cholecalciferol)  Patient Instructions: 1)  You will be contacted about your referral to GI.   2)  Please keep your follow up appointment with Cardiology. 3)  Please follow up in 3 month, sooner if problems

## 2010-06-21 NOTE — Progress Notes (Signed)
Summary: PT/INR results  Phone Note From Other Clinic Call back at [phone]   Caller: [facility] Summary of Call: PT 15.3 INR 1.5, states pt has released from hospital on 3/3, pt believes that she did not receive coumadin while in the hospital Initial call taken by: Migdalia Dk,  July 26, 2009 11:37 AM  Follow-up for Phone Call        See [facility] sheet. Follow-up by: Bethena Midget, RN, BSN,  July 26, 2009 11:54 AM

## 2010-06-21 NOTE — Letter (Signed)
Summary: [facility] - Physician Order  [facility] - Physician Order   Imported By: Marylou Mccoy 03/01/2010 12:28:54  _____________________________________________________________________  External Attachment:    Type:   Image     Comment:   External Document

## 2010-06-21 NOTE — Medication Information (Signed)
Summary: Coumadin Clinic  Anticoagulant Therapy  Managed by: Bethena Midget, RN, BSN Referring MD: Dietrich Pates, MD PCP: Tonie Griffith, MD Supervising MD: Clifton James MD, Cristal Deer Indication 1: Atrial Fibrillation Lab Used: [facility] [place] Site: [address] PT 36.1 INR POC 3.6 INR RANGE 2-3           Allergies: No Known Drug Allergies  Anticoagulation Management History:      Her anticoagulation is being managed by telephone today.  Positive risk factors for bleeding include an age of 75 years or older.  The bleeding index is 'intermediate risk'.  Positive CHADS2 values include History of CHF and Age > 8 years old.  Prothrombin time is 36.1.  Anticoagulation responsible provider: Clifton James MD, Cristal Deer.  INR POC: 3.6.    Anticoagulation Management Assessment/Plan:      The patient's current anticoagulation dose is Warfarin sodium 2.5 mg tabs: Use as directed by Anticoagualtion Clinic.  The target INR is 2.0-3.0.  The next INR is due 07/20/2009.  Anticoagulation instructions were given to [place]/[facility] RN.  Results were reviewed/authorized by Bethena Midget, RN, BSN.         Prior Anticoagulation Instructions: INR 2.9  Called spoke with pt.  Advised to continue on same dosage 1 tablet daily except 2 tablets on Mondays.  Recheck in 1 week.  Attempted to call [place] with [facility] [phone].  LMOM TCB for redraw orders.  Called spoke with Meriam Sprague at [facility] gave verbal orders to recheck in 1 week. Cloyde Reams RN  July 13, 2009 3:41 PM   Current Anticoagulation Instructions: INR 3.6 LMOM to hold coumadin today. Call back asap for further dose instruction. Bethena Midget, RN, BSN  July 20, 2009 3:09 PM [facility] to call for dosing. Bethena Midget, RN, BSN  July 21, 2009 11:40 AM Pt. admitted to hospital 07/20/09, warfarin on hold at present. Bethena Midget, RN, BSN  July 21, 2009 4:30 PM

## 2010-06-21 NOTE — Assessment & Plan Note (Signed)
Summary: Cardiology Nuclear Study  Nuclear Med Background Indications for Stress Test: Evaluation for Ischemia, Surgical Clearance  Indications Comments: Pending surgical repair of rectal prolapse  History: COPD, Echo, Emphysema  History Comments: 1/11 Echo: EF=45%. Hx. AFIB, CHF, Mild LVD, Pulmonary HTN.  Symptoms: DOE, Fatigue, Palpitations, SOB  Symptoms Comments: Recent hypotension, corrected in ED with normal saline.   Nuclear Pre-Procedure Caffeine/Decaff Intake: None NPO After: 6:00 PM Lungs: clear IV 0.9% NS with Angio Cath: 20g     IV Site: (R) AC IV Started by: Irean Hong RN Chest Size (in) 34     Cup Size A     Height (in): 61 Weight (lb): 99 BMI: 18.77  Nuclear Med Study 1 or 2 day study:  1 day     Stress Test Type:  Eugenie Birks Reading MD:  Marca Ancona, MD     Referring MD:  Dietrich Pates, MD Resting Radionuclide:  Technetium 18m Tetrofosmin     Resting Radionuclide Dose:  11.0 mCi  Stress Radionuclide:  Technetium 3m Tetrofosmin     Stress Radionuclide Dose:  31.0 mCi   Stress Protocol   Lexiscan: 0.4 mg   Stress Test Technologist:  Milana Na EMT-P     Nuclear Technologist:  Domenic Polite CNMT  Rest Procedure  Myocardial perfusion imaging was performed at rest 45 minutes following the intravenous administration of Myoview Technetium 64m Tetrofosmin.  Stress Procedure  The patient received IV Lexiscan 0.4 mg over 15-seconds.  Myoview injected at 30-seconds.  There were no significant changes, but freq Bigemenal pvcs/cuplets, and a short run of vtach with infusion.  Quantitative spect images were obtained after a 45 minute delay.  QPS Raw Data Images:  Mild breast attenuation.  Normal left ventricular size. Stress Images:  NI: Uniform and normal uptake of tracer in all myocardial segments. Rest Images:  Normal homogeneous uptake in all areas of the myocardium. Subtraction (SDS):  There is no evidence of scar or ischemia. Transient Ischemic  Dilatation:  1.10  (Normal <1.22)  Lung/Heart Ratio:  .31  (Normal <0.45)  Quantitative Gated Spect Images QGS cine images:  not gated   Overall Impression  Exercise Capacity: Lexiscan study BP Response: Hypotensive blood pressure response. Clinical Symptoms: Lightheadedness ECG Impression: Atrial fibrillation, frequent PVCs.  No ischemic ST changes.  Overall Impression: Normal stress nuclear study.  Appended Document: Cardiology Nuclear Study Normal lexiscan myoview.  Patient is at low risk for major cardiac complicaition with upcoming surgery.  OK to proceed without any further testing.  Afib with any rapid rates ccan be treated in periop period as needed.  Appended Document: Cardiology Nuclear Study Patient aware of test results.  Appended Document: Cardiology Nuclear Study Patient's daughter [facility] aware of test results.

## 2010-06-21 NOTE — Letter (Signed)
Summary: [facility] [place]  [facility] [place]   Imported By: Marylou Mccoy 04/13/2010 15:04:34  _____________________________________________________________________  External Attachment:    Type:   Image     Comment:   External Document

## 2010-06-21 NOTE — Miscellaneous (Signed)
Summary: Face to Face Encounter/[facility]  Face to Face Encounter/[facility]   Imported By: Lanelle Bal 09/29/2009 09:46:12  _____________________________________________________________________  External Attachment:    Type:   Image     Comment:   External Document

## 2010-06-23 NOTE — Medication Information (Signed)
Summary: rov/nb  Anticoagulant Therapy  Managed by: Weston Brass, PharmD Referring MD: Dietrich Pates, MD PCP: Tonie Griffith, MD Supervising MD: Tenny Craw MD, Gunnar Fusi Indication 1: Atrial Fibrillation Lab Used: [facility] [place] Site: [address] INR POC 1.8 INR RANGE 2-3  Dietary changes: no    Health status changes: no    Bleeding/hemorrhagic complications: no    Recent/future hospitalizations: no    Any changes in medication regimen? no    Recent/future dental: no  Any missed doses?: no       Is patient compliant with meds? yes       Allergies: No Known Drug Allergies  Anticoagulation Management History:      The patient is taking warfarin and comes in today for a routine follow up visit.  Positive risk factors for bleeding include an age of 75 years or older and presence of serious comorbidities.  The bleeding index is 'intermediate risk'.  Positive CHADS2 values include History of CHF and Age > 75 years old.  Anticoagulation responsible provider: Tenny Craw MD, Gunnar Fusi.  INR POC: 1.8.  Cuvette Lot#: [id number].  Exp: 06/2011.    Anticoagulation Management Assessment/Plan:      The patient's current anticoagulation dose is Warfarin sodium 2.5 mg tabs: Use as directed by Anticoagualtion Clinic.  The target INR is 2.0-3.0.  The next INR is due 05/30/2010.  Anticoagulation instructions were given to patient/[facility] RN.  Results were reviewed/authorized by Weston Brass, PharmD.  She was notified by Weston Brass PharmD.         Prior Anticoagulation Instructions: INR 1.6 Take 2 tablets tomorrow then take 1 tablet everyday except 1.5 tablet on Monday, Wednesday, and Friday Recheck INR in 2 weeks   Current Anticoagulation Instructions: INR 1.8  Take an extra 1/2 tablet today then increase dose to 1 1/2 tablets every day except 1 tablet on Sunday, Tuesday and Thursday.

## 2010-06-23 NOTE — Medication Information (Signed)
Summary: rov/sp  Anticoagulant Therapy  Managed by: Bethena Midget, RN, BSN Referring MD: Dietrich Pates, MD PCP: Tonie Griffith, MD Supervising MD: Excell Seltzer MD, Casimiro Needle Indication 1: Atrial Fibrillation Lab Used: [facility] [place] Site: [address] INR POC 2.2 INR RANGE 2-3  Dietary changes: no    Health status changes: no    Bleeding/hemorrhagic complications: no    Recent/future hospitalizations: no    Any changes in medication regimen? no    Recent/future dental: no  Any missed doses?: no       Is patient compliant with meds? yes       Allergies: No Known Drug Allergies  Anticoagulation Management History:      The patient is taking warfarin and comes in today for a routine follow up visit.  Positive risk factors for bleeding include an age of 75 years or older and presence of serious comorbidities.  The bleeding index is 'intermediate risk'.  Positive CHADS2 values include History of CHF and Age > 58 years old.  Anticoagulation responsible provider: Excell Seltzer MD, Casimiro Needle.  INR POC: 2.2.  Cuvette Lot#: [id number].  Exp: 06/2011.    Anticoagulation Management Assessment/Plan:      The patient's current anticoagulation dose is Warfarin sodium 2.5 mg tabs: Use as directed by Anticoagualtion Clinic.  The target INR is 2.0-3.0.  The next INR is due 06/20/2010.  Anticoagulation instructions were given to patient/daughter.  Results were reviewed/authorized by Bethena Midget, RN, BSN.  She was notified by Bethena Midget, RN, BSN.         Prior Anticoagulation Instructions: INR 1.8  Take an extra 1/2 tablet today then increase dose to 1 1/2 tablets every day except 1 tablet on Sunday, Tuesday and Thursday.   Current Anticoagulation Instructions: INR 2.2 Continue 1.5 pills everyday except 1 pill on Sundays, Tuesdays and Thursdays. Recheck in 3 weeks.

## 2010-06-24 NOTE — Miscellaneous (Signed)
Summary: Episode Summary/[facility]  Episode Summary/[facility]   Imported By: Lanelle Bal 08/30/2009 12:52:10  _____________________________________________________________________  External Attachment:    Type:   Image     Comment:   External Document

## 2010-06-24 NOTE — Miscellaneous (Signed)
Summary: Face to Face Encounter Form/[facility]  Face to Face Encounter Form/[facility]   Imported By: Lanelle Bal 09/27/2009 12:45:53  _____________________________________________________________________  External Attachment:    Type:   Image     Comment:   External Document

## 2010-06-29 NOTE — Medication Information (Signed)
Summary: rov/tm  Anticoagulant Therapy  Managed by: Louann Sjogren, PharmD Referring MD: Dietrich Pates, MD PCP: Tonie Griffith, MD Supervising MD: Miyako Oelke MD,Janayah Zavada Indication 1: Atrial Fibrillation Lab Used: [facility] [place] Site: [address] INR POC 1.8 INR RANGE 2-3  Dietary changes: no    Health status changes: no    Bleeding/hemorrhagic complications: no    Recent/future hospitalizations: no    Any changes in medication regimen? no    Recent/future dental: no  Any missed doses?: no       Is patient compliant with meds? yes       Allergies: No Known Drug Allergies  Anticoagulation Management History:      The patient is taking warfarin and comes in today for a routine follow up visit.  Positive risk factors for bleeding include an age of 75 years or older and presence of serious comorbidities.  The bleeding index is 'intermediate risk'.  Positive CHADS2 values include History of CHF and Age > 26 years old.  Today's INR is 1.8.  Anticoagulation responsible provider: Allura Doepke MD,Drayk Humbarger.  INR POC: 1.8.  Cuvette Lot#: [id number].  Exp: 06/2011.    Anticoagulation Management Assessment/Plan:      The patient's current anticoagulation dose is Warfarin sodium 2.5 mg tabs: Use as directed by Anticoagualtion Clinic.  The target INR is 2.0-3.0.  The next INR is due 07/11/2010.  Anticoagulation instructions were given to patient/daughter.  Results were reviewed/authorized by Louann Sjogren, PharmD.         Prior Anticoagulation Instructions: INR 2.2 Continue 1.5 pills everyday except 1 pill on Sundays, Tuesdays and Thursdays. Recheck in 3 weeks.   Current Anticoagulation Instructions: INR 1.8 (goal 2-3)  Continue 1 1/2 tablets everyday except take 1 tablet on Sundays, Tuesdays, and Thursdays.  Recheck in 3 weeks.

## 2010-07-11 ENCOUNTER — Encounter: Payer: Self-pay | Admitting: Cardiovascular Disease

## 2010-07-11 ENCOUNTER — Encounter (INDEPENDENT_AMBULATORY_CARE_PROVIDER_SITE_OTHER): Payer: Medicare Other

## 2010-07-11 DIAGNOSIS — I4891 Unspecified atrial fibrillation: Secondary | ICD-10-CM

## 2010-07-11 DIAGNOSIS — Z7901 Long term (current) use of anticoagulants: Secondary | ICD-10-CM

## 2010-07-21 ENCOUNTER — Encounter (INDEPENDENT_AMBULATORY_CARE_PROVIDER_SITE_OTHER): Payer: Medicare Other

## 2010-07-21 ENCOUNTER — Encounter: Payer: Self-pay | Admitting: Internal Medicine

## 2010-07-21 DIAGNOSIS — Z7901 Long term (current) use of anticoagulants: Secondary | ICD-10-CM

## 2010-07-21 DIAGNOSIS — I4891 Unspecified atrial fibrillation: Secondary | ICD-10-CM

## 2010-07-21 LAB — CONVERTED CEMR LAB: POC INR: 2.4

## 2010-07-28 NOTE — Medication Information (Signed)
Summary: rov/sp  Anticoagulant Therapy  Managed by: Cloyde Reams, RN, BSN Referring MD: Dietrich Pates, MD PCP: Tonie Griffith, MD Supervising MD: Tenny Craw MD, Gunnar Fusi Indication 1: Atrial Fibrillation Lab Used: [facility] [place] Site: [address] INR POC 2.4 INR RANGE 2-3  Dietary changes: no    Health status changes: no    Bleeding/hemorrhagic complications: no    Recent/future hospitalizations: no    Any changes in medication regimen? no    Recent/future dental: no  Any missed doses?: no       Is patient compliant with meds? yes       Allergies: No Known Drug Allergies  Anticoagulation Management History:      The patient is taking warfarin and comes in today for a routine follow up visit.  Positive risk factors for bleeding include an age of 75 years or older and presence of serious comorbidities.  The bleeding index is 'intermediate risk'.  Positive CHADS2 values include History of CHF and Age > 21 years old.  Her last INR was 1.8.  Anticoagulation responsible provider: Tenny Craw MD, Gunnar Fusi.  INR POC: 2.4.  Cuvette Lot#: [id number].  Exp: 05/2011.    Anticoagulation Management Assessment/Plan:      The patient's current anticoagulation dose is Warfarin sodium 2.5 mg tabs: Use as directed by Anticoagualtion Clinic.  The target INR is 2.0-3.0.  The next INR is due 08/18/2010.  Anticoagulation instructions were given to patient/daughter.  Results were reviewed/authorized by Cloyde Reams, RN, BSN.  She was notified by Cloyde Reams RN.         Prior Anticoagulation Instructions: INR 1.8 (goal 2-3)  Continue 1 1/2 tablets everyday except take 1 tablet on Sundays, Tuesdays, and Thursdays.  Recheck in 3 weeks.   Current Anticoagulation Instructions: INR 2.4  Continue on same dosage 1.5 tablets daily except 1 tablet on Sundays, Tuesdays, and Thursdays.  Recheck in 4 weeks.

## 2010-08-07 LAB — CBC
Hemoglobin: 9.5 g/dL — ABNORMAL LOW (ref 12.0–15.0)
MCHC: 33.8 g/dL (ref 30.0–36.0)
MCHC: 34.7 g/dL (ref 30.0–36.0)
MCV: 97.8 fL (ref 78.0–100.0)
Platelets: 228 10*3/uL (ref 150–400)
Platelets: 285 10*3/uL (ref 150–400)
RBC: 2.82 MIL/uL — ABNORMAL LOW (ref 3.87–5.11)
RBC: 4.16 MIL/uL (ref 3.87–5.11)
RDW: 13.9 % (ref 11.5–15.5)
WBC: 7.7 10*3/uL (ref 4.0–10.5)
WBC: 10.3 10*3/uL (ref 4.0–10.5)

## 2010-08-07 LAB — COMPREHENSIVE METABOLIC PANEL
AST: 23 U/L (ref 0–37)
Albumin: 3.4 g/dL — ABNORMAL LOW (ref 3.5–5.2)
Alkaline Phosphatase: 68 U/L (ref 39–117)
BUN: 53 mg/dL — ABNORMAL HIGH (ref 6–23)
Chloride: 106 mEq/L (ref 96–112)
Potassium: 5.5 mEq/L — ABNORMAL HIGH (ref 3.5–5.1)
Total Bilirubin: 0.7 mg/dL (ref 0.3–1.2)

## 2010-08-07 LAB — BASIC METABOLIC PANEL
BUN: 31 mg/dL — ABNORMAL HIGH (ref 6–23)
Calcium: 9.3 mg/dL (ref 8.4–10.5)
GFR calc non Af Amer: 59 mL/min — ABNORMAL LOW (ref >60)
Glucose, Bld: 125 mg/dL — ABNORMAL HIGH (ref 70–99)

## 2010-08-07 LAB — CREATININE, SERUM
Creatinine, Ser: 0.87 mg/dL (ref 0.4–1.2)
GFR calc Af Amer: >60 mL/min (ref >60)

## 2010-08-07 LAB — PROTIME-INR: INR: 3.4 — ABNORMAL HIGH (ref 0.00–1.49)

## 2010-08-07 LAB — HEMOGLOBIN: Hemoglobin: 9.6 g/dL — ABNORMAL LOW (ref 12.0–15.0)

## 2010-08-07 LAB — POTASSIUM: Potassium: 3.8 mEq/L (ref 3.5–5.1)

## 2010-08-07 LAB — APTT: aPTT: 46 seconds — ABNORMAL HIGH (ref 24–37)

## 2010-08-08 LAB — CK TOTAL AND CKMB (NOT AT ARMC)
CK, MB: 144.9 ng/mL (ref 0.3–4.0)
CK, MB: 4.3 ng/mL — ABNORMAL HIGH (ref 0.3–4.0)
Relative Index: 2.3 (ref 0.0–2.5)
Relative Index: 2.3 (ref 0.0–2.5)
Total CK: 188 U/L — ABNORMAL HIGH (ref 7–177)

## 2010-08-08 LAB — COMPREHENSIVE METABOLIC PANEL
ALT: 89 U/L — ABNORMAL HIGH (ref 0–35)
Alkaline Phosphatase: 62 U/L (ref 39–117)
BUN: 15 mg/dL (ref 6–23)
CO2: 24 mEq/L (ref 19–32)
CO2: 20 mEq/L (ref 19–32)
Chloride: 114 mEq/L — ABNORMAL HIGH (ref 96–112)
Chloride: 113 mEq/L — ABNORMAL HIGH (ref 96–112)
Creatinine, Ser: 0.77 mg/dL (ref 0.4–1.2)
GFR calc non Af Amer: >60 mL/min (ref >60)
Glucose, Bld: 115 mg/dL — ABNORMAL HIGH (ref 70–99)
Glucose, Bld: 90 mg/dL (ref 70–99)
Potassium: 4.1 mEq/L (ref 3.5–5.1)
Sodium: 136 mEq/L (ref 135–145)
Total Bilirubin: 0.5 mg/dL (ref 0.3–1.2)
Total Protein: 4.6 g/dL — ABNORMAL LOW (ref 6.0–8.3)

## 2010-08-08 LAB — CBC
HCT: 37.5 % (ref 36.0–46.0)
HCT: 38.6 % (ref 36.0–46.0)
HCT: 39 % (ref 36.0–46.0)
HCT: 39.1 % (ref 36.0–46.0)
HCT: 29.9 % — ABNORMAL LOW (ref 36.0–46.0)
HCT: 31 % — ABNORMAL LOW (ref 36.0–46.0)
HCT: 33.2 % — ABNORMAL LOW (ref 36.0–46.0)
Hemoglobin: 12.5 g/dL (ref 12.0–15.0)
Hemoglobin: 12.8 g/dL (ref 12.0–15.0)
Hemoglobin: 13.3 g/dL (ref 12.0–15.0)
Hemoglobin: 13.3 g/dL (ref 12.0–15.0)
Hemoglobin: 10.2 g/dL — ABNORMAL LOW (ref 12.0–15.0)
Hemoglobin: 10.4 g/dL — ABNORMAL LOW (ref 12.0–15.0)
Hemoglobin: 11 g/dL — ABNORMAL LOW (ref 12.0–15.0)
MCHC: 33 g/dL (ref 30.0–36.0)
MCHC: 34.2 g/dL (ref 30.0–36.0)
MCHC: 33.2 g/dL (ref 30.0–36.0)
MCV: 96.2 fL (ref 78.0–100.0)
MCV: 96.3 fL (ref 78.0–100.0)
MCV: 96.5 fL (ref 78.0–100.0)
MCV: 100.5 fL — ABNORMAL HIGH (ref 78.0–100.0)
Platelets: 173 10*3/uL (ref 150–400)
Platelets: 180 10*3/uL (ref 150–400)
Platelets: 187 10*3/uL (ref 150–400)
Platelets: 203 10*3/uL (ref 150–400)
RBC: 3.9 MIL/uL (ref 3.87–5.11)
RBC: 3.95 MIL/uL (ref 3.87–5.11)
RBC: 4.05 MIL/uL (ref 3.87–5.11)
RBC: 3.1 MIL/uL — ABNORMAL LOW (ref 3.87–5.11)
RBC: 3.19 MIL/uL — ABNORMAL LOW (ref 3.87–5.11)
RBC: 3.31 MIL/uL — ABNORMAL LOW (ref 3.87–5.11)
RDW: 14.3 % (ref 11.5–15.5)
RDW: 14.3 % (ref 11.5–15.5)
RDW: 14.4 % (ref 11.5–15.5)
RDW: 14.6 % (ref 11.5–15.5)
RDW: 14.6 % (ref 11.5–15.5)
RDW: 16.2 % — ABNORMAL HIGH (ref 11.5–15.5)
RDW: 17.1 % — ABNORMAL HIGH (ref 11.5–15.5)
WBC: 6.6 10*3/uL (ref 4.0–10.5)
WBC: 7.1 10*3/uL (ref 4.0–10.5)
WBC: 7.2 10*3/uL (ref 4.0–10.5)
WBC: 7.2 10*3/uL (ref 4.0–10.5)
WBC: 7.4 10*3/uL (ref 4.0–10.5)

## 2010-08-08 LAB — PROTIME-INR
INR: 1.12 (ref 0.00–1.49)
INR: 1.23 (ref 0.00–1.49)
INR: 1.27 (ref 0.00–1.49)
INR: 2.81 — ABNORMAL HIGH (ref 0.00–1.49)
INR: 1.37 (ref 0.00–1.49)
INR: 1.44 (ref 0.00–1.49)
Prothrombin Time: 14.3 seconds (ref 11.6–15.2)
Prothrombin Time: 15.8 seconds — ABNORMAL HIGH (ref 11.6–15.2)
Prothrombin Time: 18.7 seconds — ABNORMAL HIGH (ref 11.6–15.2)
Prothrombin Time: 16.8 seconds — ABNORMAL HIGH (ref 11.6–15.2)
Prothrombin Time: 17.4 seconds — ABNORMAL HIGH (ref 11.6–15.2)
Prothrombin Time: 26.3 seconds — ABNORMAL HIGH (ref 11.6–15.2)

## 2010-08-08 LAB — BASIC METABOLIC PANEL
BUN: 16 mg/dL (ref 6–23)
BUN: 22 mg/dL (ref 6–23)
BUN: 23 mg/dL (ref 6–23)
BUN: 24 mg/dL — ABNORMAL HIGH (ref 6–23)
BUN: 46 mg/dL — ABNORMAL HIGH (ref 6–23)
CO2: 27 mEq/L (ref 19–32)
CO2: 28 mEq/L (ref 19–32)
CO2: 30 mEq/L (ref 19–32)
CO2: 21 mEq/L (ref 19–32)
Calcium: 8.5 mg/dL (ref 8.4–10.5)
Calcium: 8.7 mg/dL (ref 8.4–10.5)
Calcium: 8.8 mg/dL (ref 8.4–10.5)
Calcium: 8.8 mg/dL (ref 8.4–10.5)
Calcium: 8.4 mg/dL (ref 8.4–10.5)
Chloride: 104 mEq/L (ref 96–112)
Chloride: 105 mEq/L (ref 96–112)
Creatinine, Ser: 0.77 mg/dL (ref 0.4–1.2)
Creatinine, Ser: 0.8 mg/dL (ref 0.4–1.2)
Creatinine, Ser: 0.85 mg/dL (ref 0.4–1.2)
Creatinine, Ser: 0.88 mg/dL (ref 0.4–1.2)
GFR calc Af Amer: >60 mL/min (ref >60)
GFR calc Af Amer: >60 mL/min (ref >60)
GFR calc Af Amer: >60 mL/min (ref >60)
GFR calc Af Amer: >60 mL/min (ref >60)
GFR calc non Af Amer: 57 mL/min — ABNORMAL LOW (ref >60)
GFR calc non Af Amer: >60 mL/min (ref >60)
GFR calc non Af Amer: >60 mL/min (ref >60)
Glucose, Bld: 90 mg/dL (ref 70–99)
Glucose, Bld: 95 mg/dL (ref 70–99)
Glucose, Bld: 99 mg/dL (ref 70–99)
Glucose, Bld: 84 mg/dL (ref 70–99)
Potassium: 3 mEq/L — ABNORMAL LOW (ref 3.5–5.1)
Potassium: 4.1 mEq/L (ref 3.5–5.1)
Sodium: 140 mEq/L (ref 135–145)
Sodium: 140 mEq/L (ref 135–145)
Sodium: 140 mEq/L (ref 135–145)
Sodium: 142 mEq/L (ref 135–145)

## 2010-08-08 LAB — MAGNESIUM: Magnesium: 2 mg/dL (ref 1.5–2.5)

## 2010-08-08 LAB — DIFFERENTIAL
Lymphocytes Relative: 6 % — ABNORMAL LOW (ref 12–46)
Monocytes Absolute: 0.8 10*3/uL (ref 0.1–1.0)
Monocytes Relative: 7 % (ref 3–12)
Neutro Abs: 9.3 10*3/uL — ABNORMAL HIGH (ref 1.7–7.7)

## 2010-08-08 LAB — LIPID PANEL
Cholesterol: 137 mg/dL (ref 0–200)
Cholesterol: 133 mg/dL (ref 0–200)
HDL: 31 mg/dL — ABNORMAL LOW (ref >39)
LDL Cholesterol: 76 mg/dL (ref 0–99)
Total CHOL/HDL Ratio: 3.1 RATIO

## 2010-08-08 LAB — CARDIAC PANEL(CRET KIN+CKTOT+MB+TROPI)
CK, MB: 60.8 ng/mL (ref 0.3–4.0)
CK, MB: 92 ng/mL (ref 0.3–4.0)
Relative Index: 4.2 — ABNORMAL HIGH (ref 0.0–2.5)
Relative Index: 4.3 — ABNORMAL HIGH (ref 0.0–2.5)

## 2010-08-08 LAB — GLUCOSE, CAPILLARY

## 2010-08-08 LAB — BRAIN NATRIURETIC PEPTIDE
Pro B Natriuretic peptide (BNP): 1092 pg/mL — ABNORMAL HIGH (ref 0.0–100.0)
Pro B Natriuretic peptide (BNP): 1297 pg/mL — ABNORMAL HIGH (ref 0.0–100.0)
Pro B Natriuretic peptide (BNP): 1392 pg/mL — ABNORMAL HIGH (ref 0.0–100.0)

## 2010-08-08 LAB — TSH: TSH: 3.266 u[IU]/mL (ref 0.350–4.500)

## 2010-08-08 LAB — HEPARIN LEVEL (UNFRACTIONATED)
Heparin Unfractionated: 0.32 IU/mL (ref 0.30–0.70)
Heparin Unfractionated: 0.45 IU/mL (ref 0.30–0.70)

## 2010-08-08 LAB — IRON AND TIBC
Iron: 25 ug/dL — ABNORMAL LOW (ref 42–135)
TIBC: 170 ug/dL — ABNORMAL LOW (ref 250–470)

## 2010-08-08 LAB — RETICULOCYTES: Retic Count, Absolute: 54.2 10*3/uL (ref 19.0–186.0)

## 2010-08-09 LAB — COMPREHENSIVE METABOLIC PANEL
ALT: 16 U/L (ref 0–35)
AST: 24 U/L (ref 0–37)
CO2: 18 mEq/L — ABNORMAL LOW (ref 19–32)
Calcium: 8.8 mg/dL (ref 8.4–10.5)
Chloride: 106 mEq/L (ref 96–112)
Creatinine, Ser: 1.97 mg/dL — ABNORMAL HIGH (ref 0.4–1.2)
GFR calc Af Amer: 29 mL/min — ABNORMAL LOW (ref >60)
GFR calc non Af Amer: 24 mL/min — ABNORMAL LOW (ref >60)
Glucose, Bld: 78 mg/dL (ref 70–99)
Total Bilirubin: 0.6 mg/dL (ref 0.3–1.2)

## 2010-08-09 LAB — CBC
HCT: 34.7 % — ABNORMAL LOW (ref 36.0–46.0)
HCT: 35.3 % — ABNORMAL LOW (ref 36.0–46.0)
Hemoglobin: 12.4 g/dL (ref 12.0–15.0)
MCHC: 34.3 g/dL (ref 30.0–36.0)
MCHC: 35.1 g/dL (ref 30.0–36.0)
MCV: 95.2 fL (ref 78.0–100.0)
Platelets: 214 10*3/uL (ref 150–400)
RBC: 3.71 MIL/uL — ABNORMAL LOW (ref 3.87–5.11)
RDW: 16.9 % — ABNORMAL HIGH (ref 11.5–15.5)
WBC: 6.7 10*3/uL (ref 4.0–10.5)

## 2010-08-09 LAB — URINE CULTURE

## 2010-08-09 LAB — DIFFERENTIAL
Basophils Absolute: 0 10*3/uL (ref 0.0–0.1)
Eosinophils Absolute: 0 10*3/uL (ref 0.0–0.7)
Eosinophils Absolute: 0.3 10*3/uL (ref 0.0–0.7)
Eosinophils Relative: 0 % (ref 0–5)
Eosinophils Relative: 4 % (ref 0–5)
Lymphocytes Relative: 16 % (ref 12–46)
Lymphs Abs: 0.5 10*3/uL — ABNORMAL LOW (ref 0.7–4.0)
Lymphs Abs: 1.1 10*3/uL (ref 0.7–4.0)
Monocytes Absolute: 0.6 10*3/uL (ref 0.1–1.0)
Neutrophils Relative %: 69 % (ref 43–77)

## 2010-08-09 LAB — URINE MICROSCOPIC-ADD ON

## 2010-08-09 LAB — BASIC METABOLIC PANEL
Calcium: 9 mg/dL (ref 8.4–10.5)
Creatinine, Ser: 1.44 mg/dL — ABNORMAL HIGH (ref 0.4–1.2)
GFR calc Af Amer: 42 mL/min — ABNORMAL LOW (ref >60)

## 2010-08-09 LAB — URINALYSIS, ROUTINE W REFLEX MICROSCOPIC
Glucose, UA: NEGATIVE mg/dL
Protein, ur: 30 mg/dL — AB
Specific Gravity, Urine: 1.018 (ref 1.005–1.030)
pH: 5 (ref 5.0–8.0)

## 2010-08-09 LAB — HEMOCCULT GUIAC POC 1CARD (OFFICE): Fecal Occult Bld: NEGATIVE

## 2010-08-09 LAB — CK TOTAL AND CKMB (NOT AT ARMC): Relative Index: 5.3 — ABNORMAL HIGH (ref 0.0–2.5)

## 2010-08-09 LAB — PROTIME-INR
INR: 4.67 — ABNORMAL HIGH (ref 0.00–1.49)
Prothrombin Time: 27.7 seconds — ABNORMAL HIGH (ref 11.6–15.2)
Prothrombin Time: 43.7 seconds — ABNORMAL HIGH (ref 11.6–15.2)

## 2010-08-09 NOTE — Medication Information (Signed)
Summary: rov/amr  Anticoagulant Therapy  Managed by: Georgina Pillion, PharmD Referring MD: Dietrich Pates, MD PCP: Tonie Griffith, MD Supervising MD: Clifton James MD, Cristal Deer Indication 1: Atrial Fibrillation Lab Used: [facility]  Site: [address] INR POC 1.6 INR RANGE 2-3  Dietary changes: no    Health status changes: no    Bleeding/hemorrhagic complications: no    Recent/future hospitalizations: no    Any changes in medication regimen? no    Recent/future dental: no  Any missed doses?: no       Is patient compliant with meds? yes       Allergies: No Known Drug Allergies  Anticoagulation Management History:      The patient is taking warfarin and comes in today for a routine follow up visit.  Positive risk factors for bleeding include an age of 75 years or older and presence of serious comorbidities.  The bleeding index is 'intermediate risk'.  Positive CHADS2 values include History of CHF and Age > 91 years old.  Her last INR was 1.8.  Anticoagulation responsible provider: Clifton James MD, Cristal Deer.  INR POC: 1.6.  Cuvette Lot#: [id number].  Exp: 06/2011.    Anticoagulation Management Assessment/Plan:      The patient's current anticoagulation dose is Warfarin sodium 2.5 mg tabs: Use as directed by Anticoagualtion Clinic.  The target INR is 2.0-3.0.  The next INR is due 07/21/2010.  Anticoagulation instructions were given to patient/daughter.  Results were reviewed/authorized by Georgina Pillion, PharmD.  She was notified by Georgina Pillion PharmD.         Prior Anticoagulation Instructions: INR 1.8 (goal 2-3)  Continue 1 1/2 tablets everyday except take 1 tablet on Sundays, Tuesdays, and Thursdays.  Recheck in 3 weeks.   Current Anticoagulation Instructions: Take an extra 1/2 tablet today. Then change regimen to take 1 1/2 tablets (3.75 mg) daily EXCEPT for 1 tablet on Sundays and Thursdays.  INR 1.6

## 2010-08-10 LAB — CREATININE, SERUM
Creatinine, Ser: 1.58 mg/dL — ABNORMAL HIGH (ref 0.4–1.2)
GFR calc non Af Amer: 31 mL/min — ABNORMAL LOW (ref >60)

## 2010-08-10 LAB — BUN: BUN: 53 mg/dL — ABNORMAL HIGH (ref 6–23)

## 2010-08-15 LAB — DRUGS OF ABUSE SCREEN W/O ALC, ROUTINE URINE
Amphetamine Screen, Ur: NEGATIVE
Barbiturate Quant, Ur: NEGATIVE
Benzodiazepines.: NEGATIVE
Marijuana Metabolite: NEGATIVE
Methadone: NEGATIVE
Phencyclidine (PCP): NEGATIVE

## 2010-08-15 LAB — COMPREHENSIVE METABOLIC PANEL
ALT: 16 U/L (ref 0–35)
ALT: 21 U/L (ref 0–35)
AST: 20 U/L (ref 0–37)
AST: 25 U/L (ref 0–37)
Albumin: 3.5 g/dL (ref 3.5–5.2)
Alkaline Phosphatase: 53 U/L (ref 39–117)
CO2: 22 mEq/L (ref 19–32)
Calcium: 9 mg/dL (ref 8.4–10.5)
Calcium: 9.7 mg/dL (ref 8.4–10.5)
GFR calc Af Amer: 43 mL/min — ABNORMAL LOW (ref >60)
GFR calc Af Amer: 44 mL/min — ABNORMAL LOW (ref >60)
GFR calc non Af Amer: 36 mL/min — ABNORMAL LOW (ref >60)
Glucose, Bld: 72 mg/dL (ref 70–99)
Potassium: 4.3 mEq/L (ref 3.5–5.1)
Potassium: 4.8 mEq/L (ref 3.5–5.1)
Sodium: 135 mEq/L (ref 135–145)
Sodium: 136 mEq/L (ref 135–145)
Total Protein: 6.9 g/dL (ref 6.0–8.3)

## 2010-08-15 LAB — URINE CULTURE: Colony Count: 9000

## 2010-08-15 LAB — CBC
Hemoglobin: 13.4 g/dL (ref 12.0–15.0)
MCHC: 33.6 g/dL (ref 30.0–36.0)
MCHC: 34.3 g/dL (ref 30.0–36.0)
Platelets: 197 10*3/uL (ref 150–400)
RBC: 4.17 MIL/uL (ref 3.87–5.11)
RDW: 14.1 % (ref 11.5–15.5)
WBC: 5.3 10*3/uL (ref 4.0–10.5)

## 2010-08-15 LAB — MAGNESIUM
Magnesium: 2.4 mg/dL (ref 1.5–2.5)
Magnesium: 2.5 mg/dL (ref 1.5–2.5)

## 2010-08-15 LAB — URINALYSIS, ROUTINE W REFLEX MICROSCOPIC
Glucose, UA: NEGATIVE mg/dL
Ketones, ur: NEGATIVE mg/dL
Protein, ur: NEGATIVE mg/dL

## 2010-08-15 LAB — CULTURE, BLOOD (ROUTINE X 2): Culture: NO GROWTH

## 2010-08-15 LAB — PROTIME-INR: INR: 3.01 — ABNORMAL HIGH (ref 0.00–1.49)

## 2010-08-15 LAB — DIFFERENTIAL
Eosinophils Absolute: 0.1 10*3/uL (ref 0.0–0.7)
Eosinophils Relative: 2 % (ref 0–5)
Lymphs Abs: 1.1 10*3/uL (ref 0.7–4.0)
Monocytes Absolute: 0.5 10*3/uL (ref 0.1–1.0)
Monocytes Relative: 8 % (ref 3–12)
Neutrophils Relative %: 72 % (ref 43–77)

## 2010-08-15 LAB — APTT: aPTT: 59 seconds — ABNORMAL HIGH (ref 24–37)

## 2010-08-15 LAB — URINE MICROSCOPIC-ADD ON

## 2010-08-15 LAB — BRAIN NATRIURETIC PEPTIDE: Pro B Natriuretic peptide (BNP): 425 pg/mL — ABNORMAL HIGH (ref 0.0–100.0)

## 2010-08-18 ENCOUNTER — Ambulatory Visit (INDEPENDENT_AMBULATORY_CARE_PROVIDER_SITE_OTHER): Payer: Medicare Other | Admitting: *Deleted

## 2010-08-18 DIAGNOSIS — Z7901 Long term (current) use of anticoagulants: Secondary | ICD-10-CM

## 2010-08-18 DIAGNOSIS — I4891 Unspecified atrial fibrillation: Secondary | ICD-10-CM

## 2010-08-18 LAB — POCT INR: INR: 1.6

## 2010-08-18 NOTE — Patient Instructions (Signed)
Take an extra 1/2 tablet today, then start taking 1.5 tablets daily except 1 tablet on Sundays and Thursdays. Recheck in 2 weeks.

## 2010-09-01 ENCOUNTER — Ambulatory Visit (INDEPENDENT_AMBULATORY_CARE_PROVIDER_SITE_OTHER): Payer: Medicare Other | Admitting: *Deleted

## 2010-09-01 DIAGNOSIS — Z7901 Long term (current) use of anticoagulants: Secondary | ICD-10-CM

## 2010-09-01 DIAGNOSIS — I4891 Unspecified atrial fibrillation: Secondary | ICD-10-CM

## 2010-09-01 LAB — POCT INR: INR: 2.3

## 2010-09-22 ENCOUNTER — Other Ambulatory Visit: Payer: Self-pay | Admitting: Cardiovascular Disease

## 2010-09-22 ENCOUNTER — Ambulatory Visit (INDEPENDENT_AMBULATORY_CARE_PROVIDER_SITE_OTHER): Payer: Medicare Other | Admitting: *Deleted

## 2010-09-22 DIAGNOSIS — I4891 Unspecified atrial fibrillation: Secondary | ICD-10-CM

## 2010-09-22 NOTE — Telephone Encounter (Signed)
Refill

## 2010-10-20 ENCOUNTER — Ambulatory Visit (INDEPENDENT_AMBULATORY_CARE_PROVIDER_SITE_OTHER): Payer: Medicare Other | Admitting: *Deleted

## 2010-10-20 DIAGNOSIS — I4891 Unspecified atrial fibrillation: Secondary | ICD-10-CM

## 2010-10-20 LAB — POCT INR: INR: 2.3

## 2010-11-17 ENCOUNTER — Ambulatory Visit (INDEPENDENT_AMBULATORY_CARE_PROVIDER_SITE_OTHER): Payer: Medicare Other | Admitting: *Deleted

## 2010-11-17 DIAGNOSIS — I4891 Unspecified atrial fibrillation: Secondary | ICD-10-CM

## 2010-11-30 ENCOUNTER — Other Ambulatory Visit: Payer: Self-pay | Admitting: Cardiovascular Disease

## 2010-12-15 ENCOUNTER — Ambulatory Visit (INDEPENDENT_AMBULATORY_CARE_PROVIDER_SITE_OTHER): Payer: Medicare Other | Admitting: *Deleted

## 2010-12-15 DIAGNOSIS — I4891 Unspecified atrial fibrillation: Secondary | ICD-10-CM

## 2011-01-12 ENCOUNTER — Ambulatory Visit (INDEPENDENT_AMBULATORY_CARE_PROVIDER_SITE_OTHER): Payer: Medicare Other | Admitting: *Deleted

## 2011-01-12 DIAGNOSIS — I4891 Unspecified atrial fibrillation: Secondary | ICD-10-CM

## 2011-02-09 ENCOUNTER — Ambulatory Visit (INDEPENDENT_AMBULATORY_CARE_PROVIDER_SITE_OTHER): Payer: Medicare Other | Admitting: *Deleted

## 2011-02-09 DIAGNOSIS — I4891 Unspecified atrial fibrillation: Secondary | ICD-10-CM

## 2011-02-09 LAB — POCT INR: INR: 2.6

## 2011-02-15 ENCOUNTER — Encounter: Payer: Self-pay | Admitting: Internal Medicine

## 2011-02-16 ENCOUNTER — Ambulatory Visit (INDEPENDENT_AMBULATORY_CARE_PROVIDER_SITE_OTHER): Payer: Medicare Other | Admitting: Internal Medicine

## 2011-02-16 ENCOUNTER — Encounter: Payer: Self-pay | Admitting: Internal Medicine

## 2011-02-16 DIAGNOSIS — I119 Hypertensive heart disease without heart failure: Secondary | ICD-10-CM

## 2011-02-16 DIAGNOSIS — I4891 Unspecified atrial fibrillation: Secondary | ICD-10-CM

## 2011-02-16 DIAGNOSIS — I509 Heart failure, unspecified: Secondary | ICD-10-CM

## 2011-02-16 LAB — CBC WITH DIFFERENTIAL/PLATELET
Eosinophils Relative: 1.7 % (ref 0.0–5.0)
HCT: 42.1 % (ref 36.0–46.0)
Hemoglobin: 13.4 g/dL (ref 12.0–15.0)
Lymphs Abs: 1.3 10*3/uL (ref 0.7–4.0)
MCV: 100.9 fl — ABNORMAL HIGH (ref 78.0–100.0)
Monocytes Absolute: 0.6 10*3/uL (ref 0.1–1.0)
Monocytes Relative: 8.8 % (ref 3.0–12.0)
Neutro Abs: 4.5 10*3/uL (ref 1.4–7.7)
WBC: 6.4 10*3/uL (ref 4.5–10.5)

## 2011-02-16 NOTE — Assessment & Plan Note (Signed)
Volume status looks good. 

## 2011-02-16 NOTE — Patient Instructions (Signed)
Lab work today We will call you with results.  Your physician wants you to follow-up in: 12 months You will receive a reminder letter in the mail two months in advance. If you don't receive a letter, please call our office to schedule the follow-up appointment.  

## 2011-02-16 NOTE — Progress Notes (Signed)
HPIpatient is a 77 year old with a history of atrial fibrillation, CHF (mild LV dysfunction by echo), pulmonary hypertension. I last saw her in April . She had a myoview after this that showed no ischemia I saw her last fall in clnic  She continues to follow up in coumadin. No SOB.  No falls.  Holding on to things.  No dizziness.   No chest pains.  No Known Allergies  Current Outpatient Prescriptions  Medication Sig Dispense Refill  . carvedilol (COREG) 6.25 MG tablet Take 6.25 mg by mouth 2 (two) times daily with a meal.        . furosemide (LASIX) 40 MG tablet Take 40 mg by mouth daily.        . Multiple Vitamin (MULTIVITAMIN) tablet Take 1 tablet by mouth daily.        . potassium chloride SA (K-DUR,KLOR-CON) 20 MEQ tablet Take 20 mEq by mouth daily.        Marland Kitchen warfarin (COUMADIN) 2.5 MG tablet Take as directed by Anticoagulation clinic   60 tablet  3    Past Medical History  Diagnosis Date  . Arthritis   . Atrial fibrillation   . CHF (congestive heart failure)   . Rectal prolapse     Past Surgical History  Procedure Date  . Total hip arthroplasty   . Arthroscopic repair pcl     x2    Family History  Problem Relation Age of Onset  . Stroke Father   . Brain cancer Mother     History   Social History  . Marital Status: Widowed    Spouse Name: N/A    Number of Children: N/A  . Years of Education: N/A   Occupational History  . retired    Social History Main Topics  . Smoking status: Never Smoker   . Smokeless tobacco: Not on file  . Alcohol Use: No  . Drug Use: No  . Sexually Active: Not on file   Other Topics Concern  . Not on file   Social History Narrative  . No narrative on file    Review of Systems:  All systems reviewed.  They are negative to the above problem except as previously stated.  Vital Signs: BP 160/89  Pulse 73  Resp 18  Ht 5' (1.524 m)  Wt 106 lb 12.8 oz (48.444 kg)  BMI 20.86 kg/m2  Physical Exam Patient is in NAD HEENT:   Normocephalic, atraumatic. EOMI, PERRLA.  Neck: JVP is normal. No thyromegaly. No bruits.  Lungs: clear to auscultation. No rales no wheezes.  Heart: Irregular rate and rhythm. Normal S1, S2. No S3.   No significant murmurs. PMI not displaced.  Abdomen:  Supple, nontender. Normal bowel sounds. No masses. No hepatomegaly.  Extremities:   Good distal pulses throughout. No lower extremity edema.  Musculoskeletal :moving all extremities.  Neuro:   alert and oriented x3.  CN II-XII grossly intact.  EKG:  Atrial fibrillation. Occasional PVC.  83 bpm.   Assessment and Plan:

## 2011-02-16 NOTE — Assessment & Plan Note (Signed)
Doing well.  Keep on current regimen.  F/U BMET .  Otherwise f/u in 12 months.

## 2011-02-17 LAB — BASIC METABOLIC PANEL
BUN: 21 mg/dL (ref 6–23)
CO2: 24 mEq/L (ref 19–32)
Calcium: 9.6 mg/dL (ref 8.4–10.5)
Creatinine, Ser: 0.8 mg/dL (ref 0.4–1.2)
Glucose, Bld: 74 mg/dL (ref 70–99)

## 2011-02-22 ENCOUNTER — Other Ambulatory Visit: Payer: Self-pay | Admitting: Cardiovascular Disease

## 2011-03-09 ENCOUNTER — Ambulatory Visit (INDEPENDENT_AMBULATORY_CARE_PROVIDER_SITE_OTHER): Payer: Medicare Other | Admitting: *Deleted

## 2011-03-09 DIAGNOSIS — Z7901 Long term (current) use of anticoagulants: Secondary | ICD-10-CM

## 2011-03-09 DIAGNOSIS — I4891 Unspecified atrial fibrillation: Secondary | ICD-10-CM

## 2011-04-06 ENCOUNTER — Ambulatory Visit (INDEPENDENT_AMBULATORY_CARE_PROVIDER_SITE_OTHER): Payer: Medicare Other | Admitting: *Deleted

## 2011-04-06 DIAGNOSIS — Z7901 Long term (current) use of anticoagulants: Secondary | ICD-10-CM

## 2011-04-06 DIAGNOSIS — I4891 Unspecified atrial fibrillation: Secondary | ICD-10-CM

## 2011-04-23 IMAGING — CR DG CHEST 2V
2 series · 2 of 2 positions shown · non-contrast
Comparison: None

CLINICAL DATA: Atrial fibrillation.  CHF.

CHEST - 2 VIEW

[view not recorded (1 of 2)]
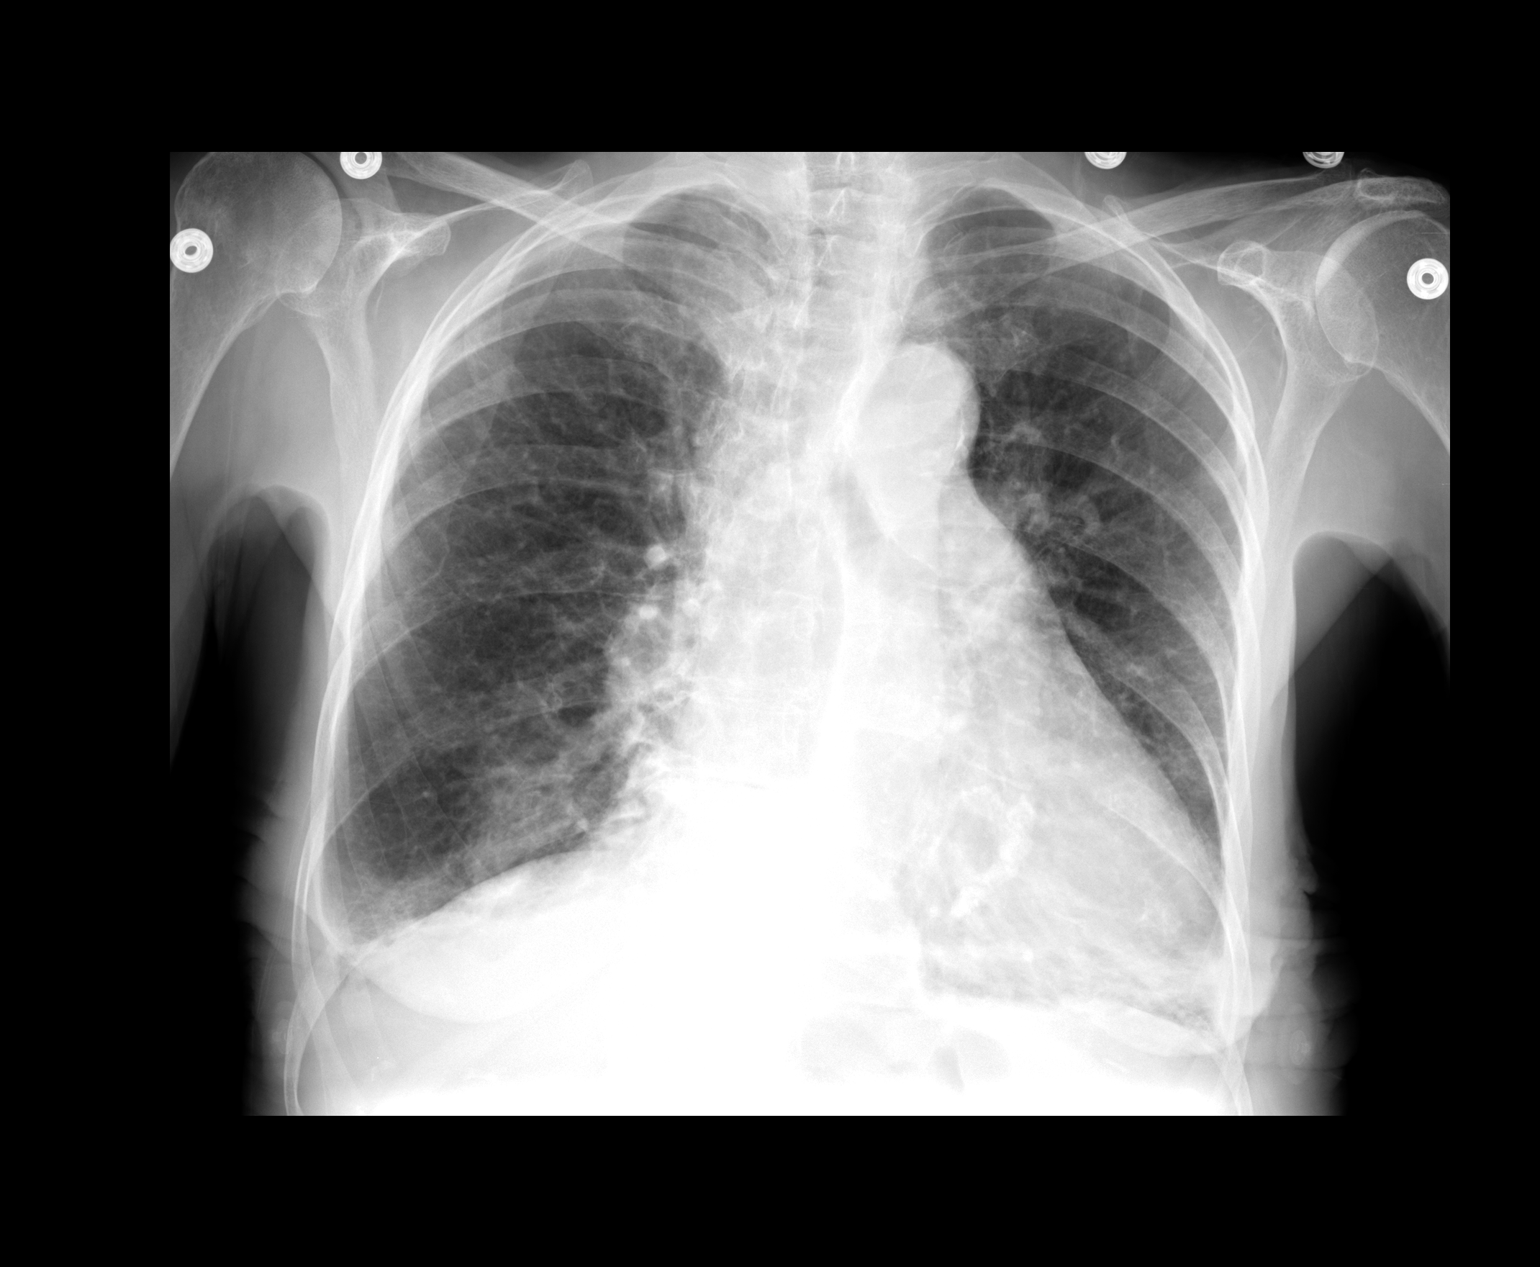

[view not recorded (2 of 2)]
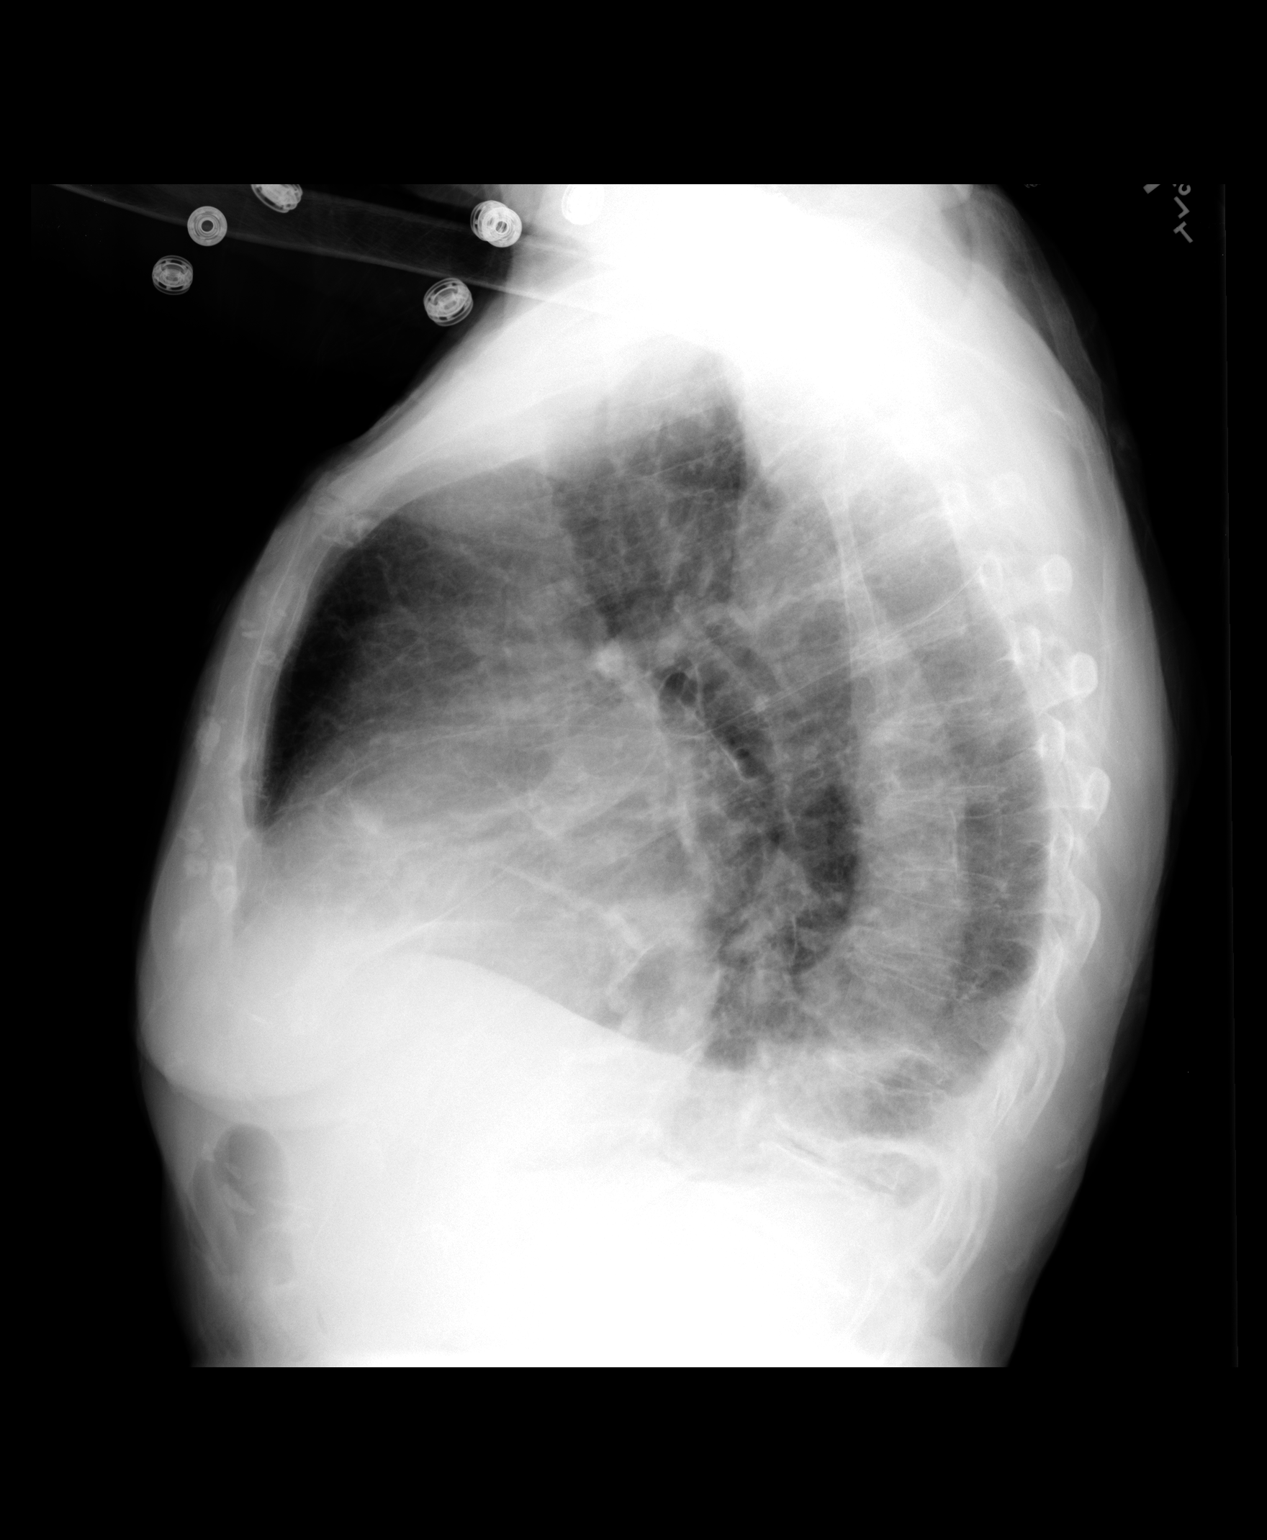

[2 of 2 positions shown; findings below may reference images not displayed]

FINDINGS: The heart is enlarged.  Calcification are noted at the
mitral valve annulus.  There is tortuosity and calcification of the
thoracic aorta.  There are chronic-appearing lung changes/COPD.
There is streaky ill-defined bibasilar lung opacity.  These
findings could reflect atelectasis or developing infiltrates.  No
pulmonary edema.  Small bilateral pleural effusions.  The bony
thorax is intact.
IMPRESSION: 1.  Cardiac enlargement.
2.  Chronic-appearing lung changes/COPD.
3.  Bibasilar lung opacities there reflect atelectasis or
developing infiltrates.
4.  No pulmonary edema.
5.  Small bilateral pleural effusions.

## 2011-05-18 ENCOUNTER — Encounter: Payer: Medicare Other | Admitting: *Deleted

## 2011-05-24 ENCOUNTER — Ambulatory Visit (INDEPENDENT_AMBULATORY_CARE_PROVIDER_SITE_OTHER): Payer: Medicare Other | Admitting: *Deleted

## 2011-05-24 DIAGNOSIS — Z7901 Long term (current) use of anticoagulants: Secondary | ICD-10-CM

## 2011-05-24 DIAGNOSIS — I4891 Unspecified atrial fibrillation: Secondary | ICD-10-CM

## 2011-07-05 ENCOUNTER — Ambulatory Visit (INDEPENDENT_AMBULATORY_CARE_PROVIDER_SITE_OTHER): Payer: Medicare Other | Admitting: *Deleted

## 2011-07-05 DIAGNOSIS — Z7901 Long term (current) use of anticoagulants: Secondary | ICD-10-CM

## 2011-07-05 DIAGNOSIS — I4891 Unspecified atrial fibrillation: Secondary | ICD-10-CM

## 2011-07-10 ENCOUNTER — Other Ambulatory Visit: Payer: Self-pay | Admitting: Cardiovascular Disease

## 2011-07-18 IMAGING — CT CT ENTEROGRAPHY (ABD-PELV W/ CM)
3 of 7 series · 13 of 32 positions shown, 18 images · IV contrast (VOLUMEN & 80ml omni 300)
Comparison: Ultrasound of 07/09/2009.  No prior CT.

CLINICAL DATA: Weight loss.  Rectal prolapse.  Rectal bleeding.
Questionable right inguinal hernia.  Evaluate for proximal colonic
lesions.

CT ABDOMEN AND PELVIS WITH CONTRAST (CT ENTEROGRAPHY)
TECHNIQUE: Multidetector CT of the abdomen and pelvis during bolus
administration of intravenous contrast. Negative oral contrast
VoLumen was given.
Contrast: 80 ml 3mnipaque-WYY intravenously and 6752 ml VoLumen
orally.

[Series 2: routine abdomen · axial · 0.67mm/px · z∈[-308,-178]mm · 2 of 78 slices shown (1 of 2)]
[im 26/78  soft-tissue]
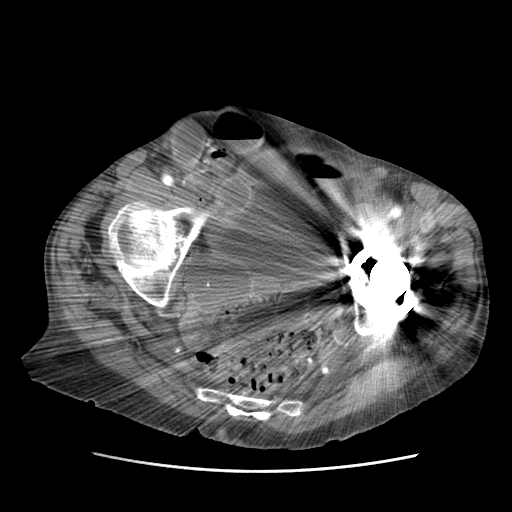
[im 52/78  soft-tissue]
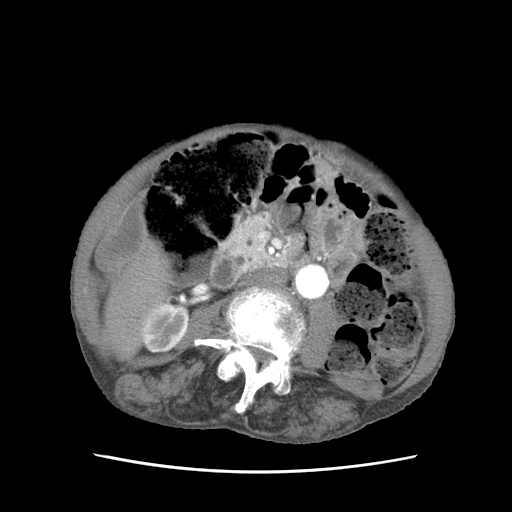

[Series 4: recon 3: routine abdomen · axial · 0.67mm/px · z∈[-362,-112]mm · 7 of 277 slices shown]
[im 26/277  soft-tissue]
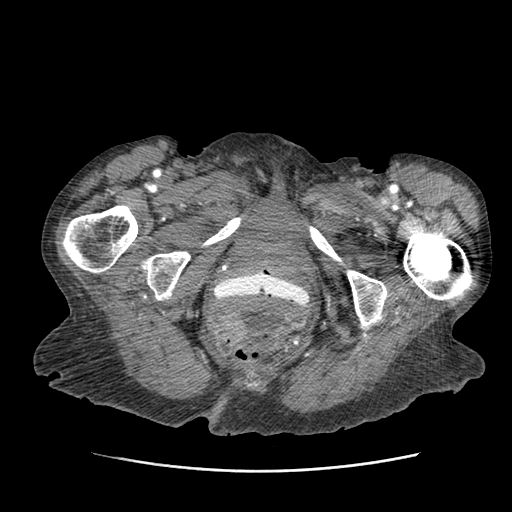
[im 51/277  soft-tissue]
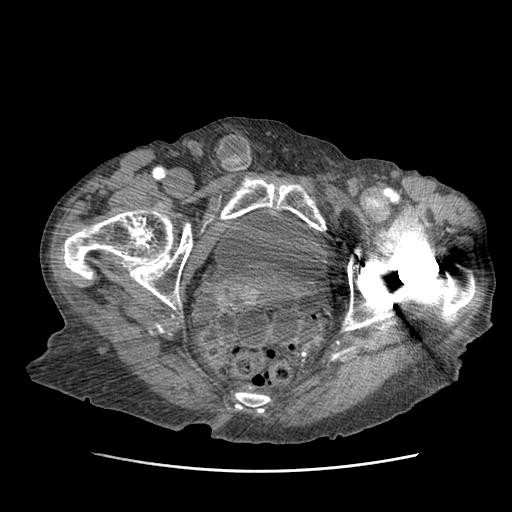
[im 101/277  soft-tissue]
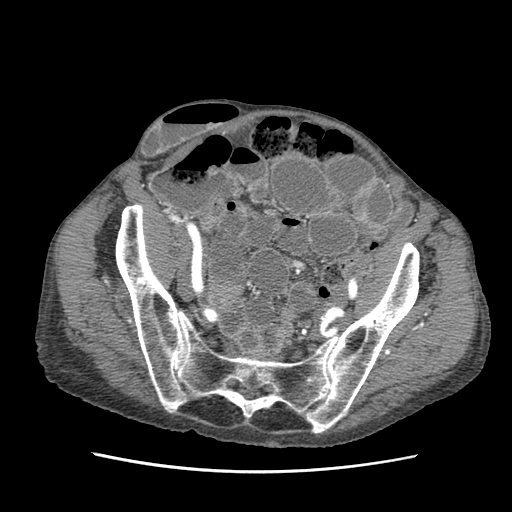
[im 126/277  soft-tissue]
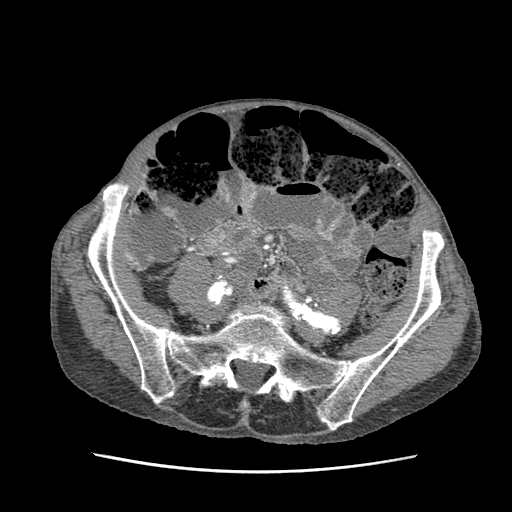
[im 151/277  soft-tissue]
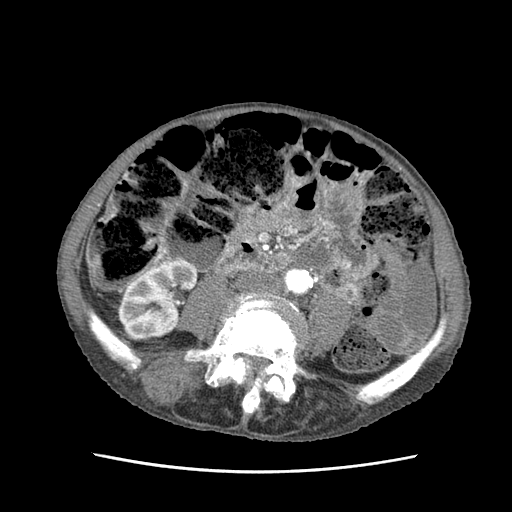
[im 176/277  soft-tissue]
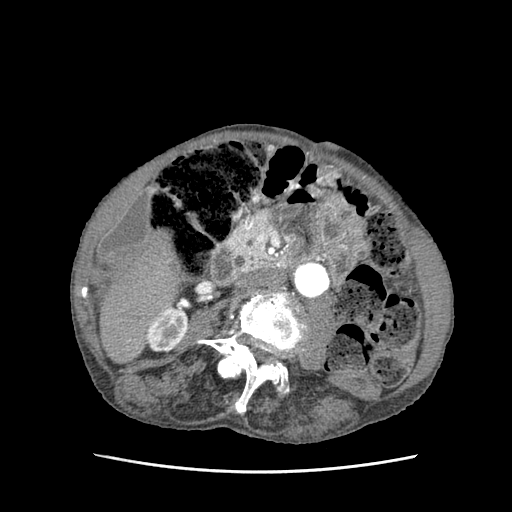
[im 226/277  soft-tissue]
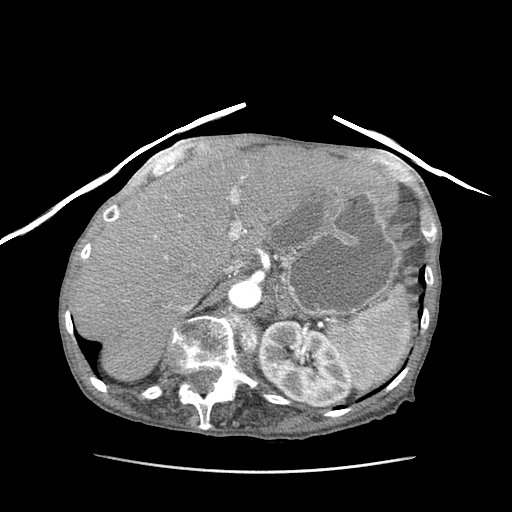

[Series 102: routine abdomen · axial · 0.67mm/px · z∈[-368,-113]mm · 4 of 154 slices shown, 9 images (2 of 2)]
[im 26/154  soft-tissue]
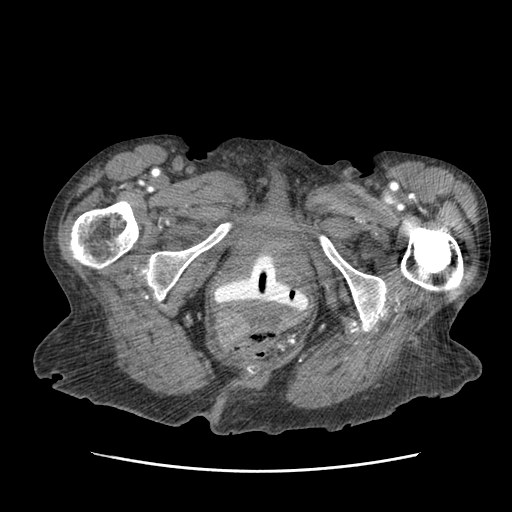
[im 26/154  lung]
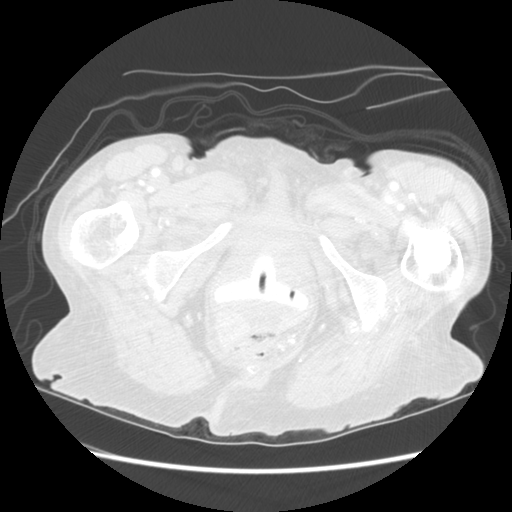
[im 26/154  bone]
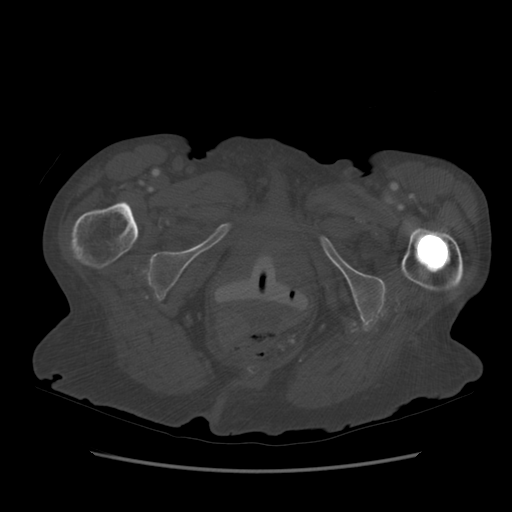
[im 77/154  soft-tissue]
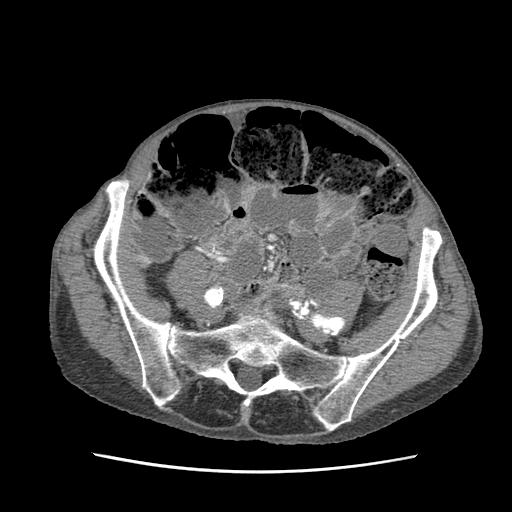
[im 77/154  lung]
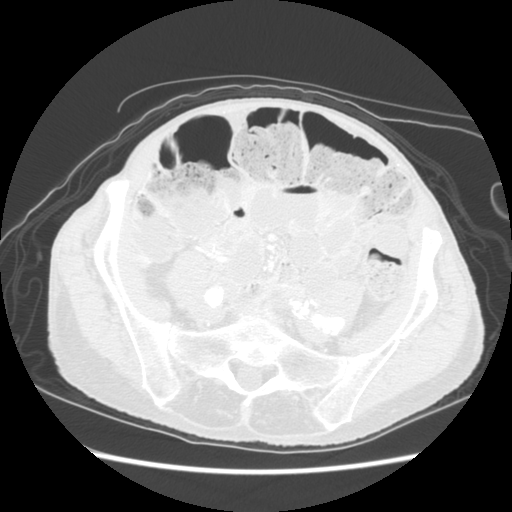
[im 103/154  soft-tissue]
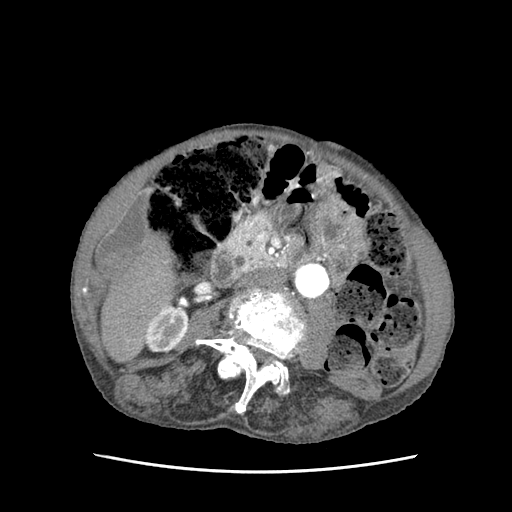
[im 103/154  lung]
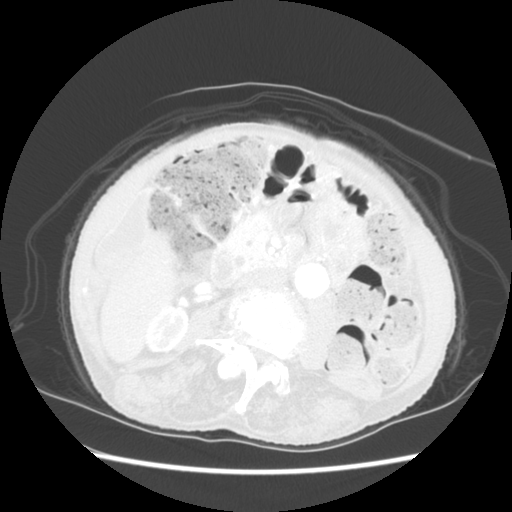
[im 128/154  soft-tissue]
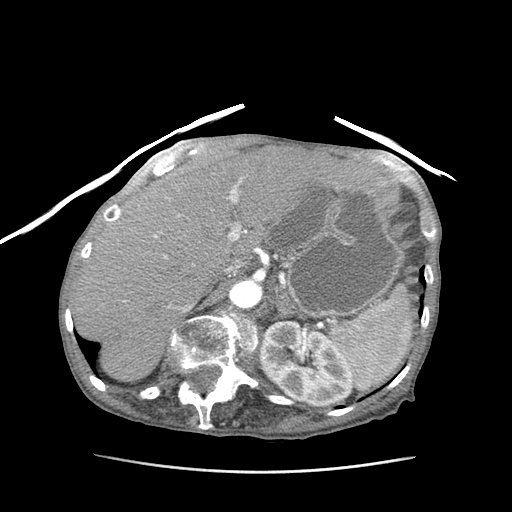
[im 128/154  lung]
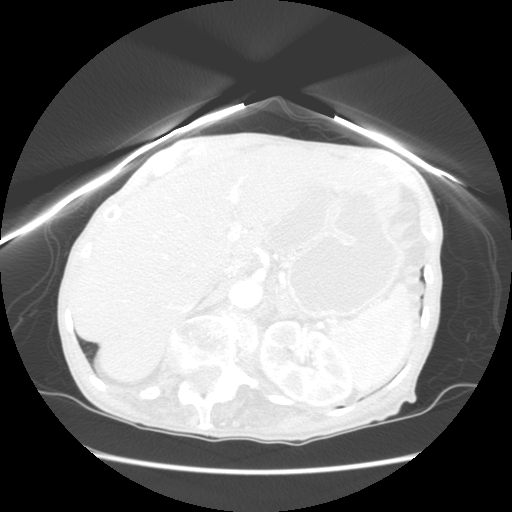

[13 of 32 positions shown; findings below may reference images not displayed]

FINDINGS: Clear lung bases.  Moderate cardiomegaly without
pericardial or pleural effusion.  A moderate hiatal hernia with
fluid filled lower thoracic stomach.

Normal liver, spleen, stomach.  Pancreatic duct upper limits of
normal for age without obstructive cause.  Prominent dorsal
pancreatic duct on image 28 of series 2. Normal gallbladder and
biliary tree.

Incompletely rotated left kidney.  No adrenal mass.  Normal right
kidney. Tortuous abdominal aorta.  No definite retroperitoneal,
retrocrural adenopathy.  Evaluation degraded by paucity of
retroperitoneal fat.

Left hip arthroplasty causes beam hardening artifact in the pelvis.
Marked colonic stool burden suggests constipation.  Lack of colon
prep makes evaluation for colonic lesions impossible.  There is no
evidence of annular/constricting colonic mass.

Small bowel distention with enteric contrast is moderate.  No bowel
obstruction.  Areas of underdistension within the proximal small
bowel (example image 32 series 2).  No areas of abnormal
enhancement or wall thickening.

Small bowel positioned within a right inguinal hernia.  This
appears to arise at the origin of the inguinal ring and extend both
cephalad and inferiorly within the inguinal canal.  No evidence of
obstruction or strangulation.  No ascites.

No pneumatosis or free intraperitoneal air.

No pelvic adenopathy.    Grossly normal urinary bladder.  Probable
hysterectomy.  Pessary in place. Trace free pelvic fluid is likely
physiologic.  Rectal prolapse on image 78 which is incompletely
imaged .  Moderate osteopenia.  Severe degenerative disease at L4-
L5 and L3-L4.  Marked spinal curvature primarily convex left
lumbar.  This distorts the appearance of the abdomen/pelvis.
IMPRESSION: 1.  Complex right-sided inguinal hernia with small bowel extending
both cephalad into the anterior pelvic wall and inferiorly into the
right inguinal canal.
2.  No small bowel obstruction.
3.  Large colonic stool burden suggests constipation.  No
constricting colonic mass.  Other colonic lesions cannot be
excluded due to non prepped non dedicated technique.
4.  Rectal prolapse with a pessary in place.
5.  Moderate hiatal hernia with fluid filled proximal thoracic
stomach.
6.  Prominent dorsal pancreatic duct as can be seen with pancreas
divisum.

## 2011-08-10 ENCOUNTER — Other Ambulatory Visit: Payer: Self-pay

## 2011-08-17 ENCOUNTER — Ambulatory Visit (INDEPENDENT_AMBULATORY_CARE_PROVIDER_SITE_OTHER): Payer: Medicare Other | Admitting: *Deleted

## 2011-08-17 DIAGNOSIS — Z7901 Long term (current) use of anticoagulants: Secondary | ICD-10-CM

## 2011-08-17 DIAGNOSIS — I4891 Unspecified atrial fibrillation: Secondary | ICD-10-CM

## 2011-09-28 ENCOUNTER — Ambulatory Visit (INDEPENDENT_AMBULATORY_CARE_PROVIDER_SITE_OTHER): Payer: Medicare Other | Admitting: *Deleted

## 2011-09-28 DIAGNOSIS — I4891 Unspecified atrial fibrillation: Secondary | ICD-10-CM

## 2011-09-28 DIAGNOSIS — Z7901 Long term (current) use of anticoagulants: Secondary | ICD-10-CM

## 2011-09-28 LAB — POCT INR: INR: 3.9

## 2011-10-06 ENCOUNTER — Other Ambulatory Visit: Payer: Self-pay | Admitting: Internal Medicine

## 2011-10-06 MED ORDER — WARFARIN SODIUM 2.5 MG PO TABS: ORAL_TABLET | ORAL | Status: DC

## 2011-10-19 ENCOUNTER — Ambulatory Visit (INDEPENDENT_AMBULATORY_CARE_PROVIDER_SITE_OTHER): Payer: Medicare Other | Admitting: Pharmacist

## 2011-10-19 DIAGNOSIS — I4891 Unspecified atrial fibrillation: Secondary | ICD-10-CM

## 2011-10-19 DIAGNOSIS — Z7901 Long term (current) use of anticoagulants: Secondary | ICD-10-CM

## 2011-10-19 LAB — POCT INR: INR: 2.7

## 2011-11-16 ENCOUNTER — Ambulatory Visit (INDEPENDENT_AMBULATORY_CARE_PROVIDER_SITE_OTHER): Payer: Medicare Other | Admitting: *Deleted

## 2011-11-16 DIAGNOSIS — I4891 Unspecified atrial fibrillation: Secondary | ICD-10-CM

## 2011-11-16 DIAGNOSIS — Z7901 Long term (current) use of anticoagulants: Secondary | ICD-10-CM

## 2011-12-20 ENCOUNTER — Ambulatory Visit (INDEPENDENT_AMBULATORY_CARE_PROVIDER_SITE_OTHER): Payer: Medicare Other | Admitting: *Deleted

## 2011-12-20 DIAGNOSIS — Z7901 Long term (current) use of anticoagulants: Secondary | ICD-10-CM

## 2011-12-20 DIAGNOSIS — I4891 Unspecified atrial fibrillation: Secondary | ICD-10-CM

## 2011-12-20 LAB — POCT INR: INR: 3.2

## 2012-01-18 ENCOUNTER — Ambulatory Visit (INDEPENDENT_AMBULATORY_CARE_PROVIDER_SITE_OTHER): Payer: Medicare Other | Admitting: *Deleted

## 2012-01-18 DIAGNOSIS — Z7901 Long term (current) use of anticoagulants: Secondary | ICD-10-CM

## 2012-01-18 DIAGNOSIS — I4891 Unspecified atrial fibrillation: Secondary | ICD-10-CM

## 2012-01-18 LAB — POCT INR: INR: 2.4

## 2012-02-08 ENCOUNTER — Ambulatory Visit (INDEPENDENT_AMBULATORY_CARE_PROVIDER_SITE_OTHER): Payer: Medicare Other | Admitting: *Deleted

## 2012-02-08 DIAGNOSIS — Z7901 Long term (current) use of anticoagulants: Secondary | ICD-10-CM

## 2012-02-08 DIAGNOSIS — I4891 Unspecified atrial fibrillation: Secondary | ICD-10-CM

## 2012-02-19 ENCOUNTER — Other Ambulatory Visit: Payer: Self-pay | Admitting: Internal Medicine

## 2012-03-07 ENCOUNTER — Ambulatory Visit (INDEPENDENT_AMBULATORY_CARE_PROVIDER_SITE_OTHER): Payer: Medicare Other

## 2012-03-07 DIAGNOSIS — Z7901 Long term (current) use of anticoagulants: Secondary | ICD-10-CM

## 2012-03-07 DIAGNOSIS — I4891 Unspecified atrial fibrillation: Secondary | ICD-10-CM

## 2012-03-11 ENCOUNTER — Other Ambulatory Visit: Payer: Self-pay | Admitting: Cardiovascular Disease

## 2012-04-25 ENCOUNTER — Ambulatory Visit (INDEPENDENT_AMBULATORY_CARE_PROVIDER_SITE_OTHER): Payer: Medicare Other

## 2012-04-25 DIAGNOSIS — I4891 Unspecified atrial fibrillation: Secondary | ICD-10-CM

## 2012-04-25 DIAGNOSIS — Z7901 Long term (current) use of anticoagulants: Secondary | ICD-10-CM

## 2012-05-21 ENCOUNTER — Encounter (HOSPITAL_COMMUNITY): Payer: Self-pay | Admitting: *Deleted

## 2012-05-21 ENCOUNTER — Inpatient Hospital Stay (HOSPITAL_COMMUNITY)
Admission: EM | Admit: 2012-05-21 | Discharge: 2012-05-23 | DRG: 381 | Disposition: A | Discharge status: Home / Self Care | Payer: Medicare Other | Attending: Internal Medicine | Admitting: Internal Medicine

## 2012-05-21 ENCOUNTER — Inpatient Hospital Stay (HOSPITAL_COMMUNITY): Payer: Medicare Other

## 2012-05-21 ENCOUNTER — Emergency Department (HOSPITAL_COMMUNITY): Payer: Medicare Other

## 2012-05-21 DIAGNOSIS — I509 Heart failure, unspecified: Secondary | ICD-10-CM

## 2012-05-21 DIAGNOSIS — I4891 Unspecified atrial fibrillation: Secondary | ICD-10-CM

## 2012-05-21 DIAGNOSIS — I340 Nonrheumatic mitral (valve) insufficiency: Secondary | ICD-10-CM | POA: Insufficient documentation

## 2012-05-21 DIAGNOSIS — IMO0002 Reserved for concepts with insufficient information to code with codable children: Secondary | ICD-10-CM | POA: Diagnosis present

## 2012-05-21 DIAGNOSIS — I5042 Chronic combined systolic (congestive) and diastolic (congestive) heart failure: Secondary | ICD-10-CM

## 2012-05-21 DIAGNOSIS — R1310 Dysphagia, unspecified: Secondary | ICD-10-CM

## 2012-05-21 DIAGNOSIS — K209 Esophagitis, unspecified without bleeding: Secondary | ICD-10-CM

## 2012-05-21 DIAGNOSIS — K311 Adult hypertrophic pyloric stenosis: Principal | ICD-10-CM | POA: Diagnosis present

## 2012-05-21 DIAGNOSIS — E876 Hypokalemia: Secondary | ICD-10-CM | POA: Diagnosis present

## 2012-05-21 DIAGNOSIS — I2789 Other specified pulmonary heart diseases: Secondary | ICD-10-CM | POA: Diagnosis present

## 2012-05-21 DIAGNOSIS — I059 Rheumatic mitral valve disease, unspecified: Secondary | ICD-10-CM | POA: Diagnosis present

## 2012-05-21 DIAGNOSIS — E860 Dehydration: Secondary | ICD-10-CM | POA: Diagnosis present

## 2012-05-21 DIAGNOSIS — T18108A Unspecified foreign body in esophagus causing other injury, initial encounter: Secondary | ICD-10-CM | POA: Diagnosis present

## 2012-05-21 DIAGNOSIS — Z7901 Long term (current) use of anticoagulants: Secondary | ICD-10-CM

## 2012-05-21 DIAGNOSIS — Z79899 Other long term (current) drug therapy: Secondary | ICD-10-CM

## 2012-05-21 DIAGNOSIS — I272 Pulmonary hypertension, unspecified: Secondary | ICD-10-CM | POA: Insufficient documentation

## 2012-05-21 DIAGNOSIS — Z96649 Presence of unspecified artificial hip joint: Secondary | ICD-10-CM

## 2012-05-21 DIAGNOSIS — K440 Diaphragmatic hernia with obstruction, without gangrene: Secondary | ICD-10-CM

## 2012-05-21 LAB — CBC WITH DIFFERENTIAL/PLATELET
Hemoglobin: 14.3 g/dL (ref 12.0–15.0)
Lymphocytes Relative: 9 % — ABNORMAL LOW (ref 12–46)
Lymphs Abs: 0.8 10*3/uL (ref 0.7–4.0)
MCH: 32.4 pg (ref 26.0–34.0)
Monocytes Relative: 7 % (ref 3–12)
Neutro Abs: 7.7 10*3/uL (ref 1.7–7.7)
Neutrophils Relative %: 85 % — ABNORMAL HIGH (ref 43–77)
RBC: 4.42 MIL/uL (ref 3.87–5.11)
WBC: 9.1 10*3/uL (ref 4.0–10.5)

## 2012-05-21 LAB — COMPREHENSIVE METABOLIC PANEL
Alkaline Phosphatase: 67 U/L (ref 39–117)
BUN: 19 mg/dL (ref 6–23)
Chloride: 111 mEq/L (ref 96–112)
GFR calc Af Amer: 73 mL/min — ABNORMAL LOW (ref >90)
Glucose, Bld: 114 mg/dL — ABNORMAL HIGH (ref 70–99)
Potassium: 3.3 mEq/L — ABNORMAL LOW (ref 3.5–5.1)
Total Bilirubin: 1.1 mg/dL (ref 0.3–1.2)

## 2012-05-21 LAB — LIPASE, BLOOD: Lipase: 24 U/L (ref 11–59)

## 2012-05-21 MED ORDER — METOPROLOL TARTRATE 1 MG/ML IV SOLN
2.5000 mg | Freq: Two times a day (BID) | INTRAVENOUS | Status: DC
  Administered 2012-05-22: 2.5 mg via INTRAVENOUS
  Filled 2012-05-21 (×4): qty 5

## 2012-05-21 MED ORDER — GLUCAGON HCL (RDNA) 1 MG IJ SOLR
0.5000 mg | Freq: Once | INTRAMUSCULAR | Status: AC
  Administered 2012-05-21: 0.5 mg via INTRAVENOUS
  Filled 2012-05-21 (×2): qty 1

## 2012-05-21 MED ORDER — INFLUENZA VIRUS VACC SPLIT PF IM SUSP
0.5000 mL | INTRAMUSCULAR | Status: AC
  Administered 2012-05-22: 0.5 mL via INTRAMUSCULAR
  Filled 2012-05-21 (×2): qty 0.5

## 2012-05-21 MED ORDER — DIAZEPAM 5 MG/ML IJ SOLN: 2.0000 mg | Freq: Once | INTRAMUSCULAR | Status: AC | PRN

## 2012-05-21 MED ORDER — SODIUM CHLORIDE 0.9 % IV SOLN: INTRAVENOUS | Status: DC

## 2012-05-21 MED ORDER — GI COCKTAIL ~~LOC~~
30.0000 mL | Freq: Once | ORAL | Status: AC
  Administered 2012-05-21: 30 mL via ORAL
  Filled 2012-05-21: qty 30

## 2012-05-21 MED ORDER — PANTOPRAZOLE SODIUM 40 MG IV SOLR
40.0000 mg | INTRAVENOUS | Status: DC
  Administered 2012-05-21: 40 mg via INTRAVENOUS
  Filled 2012-05-21 (×3): qty 40

## 2012-05-21 MED ORDER — SODIUM CHLORIDE 0.9 % IV BOLUS (SEPSIS)
500.0000 mL | Freq: Once | INTRAVENOUS | Status: AC
  Administered 2012-05-21: 500 mL via INTRAVENOUS

## 2012-05-21 MED ORDER — DIAZEPAM 5 MG/ML IJ SOLN: 2.0000 mg | Freq: Once | INTRAMUSCULAR | Status: DC

## 2012-05-21 MED ORDER — ONDANSETRON HCL 4 MG/2ML IJ SOLN: 4.0000 mg | Freq: Four times a day (QID) | INTRAMUSCULAR | Status: DC | PRN

## 2012-05-21 MED ORDER — ONDANSETRON HCL 4 MG PO TABS: 4.0000 mg | ORAL_TABLET | Freq: Four times a day (QID) | ORAL | Status: DC | PRN

## 2012-05-21 MED ORDER — SUCRALFATE 1 G PO TABS
1.0000 g | ORAL_TABLET | Freq: Three times a day (TID) | ORAL | Status: DC
  Filled 2012-05-21 (×3): qty 1

## 2012-05-21 MED ORDER — KCL IN DEXTROSE-NACL 40-5-0.45 MEQ/L-%-% IV SOLN
INTRAVENOUS | Status: DC
  Administered 2012-05-21: 75 mL/h via INTRAVENOUS
  Administered 2012-05-22: 06:00:00 via INTRAVENOUS
  Filled 2012-05-21 (×3): qty 1000

## 2012-05-21 NOTE — ED Provider Notes (Signed)
History     CSN: [id number]  Arrival date & time 05/21/12  1106   First MD Initiated Contact with Patient 05/21/12 1211      Chief Complaint  Patient presents with  . Sore Throat  . Emesis    (Consider location/radiation/quality/duration/timing/severity/associated sxs/prior treatment) Patient is a 78 y.o. female presenting with pharyngitis and vomiting. The history is provided by the patient and a relative.  Sore Throat  Emesis    patient complaining of pain when she swallows her secretions. He is ago she had abdominal pain with vomiting and since that time she has a burning type sensation when she swallows. She was however able to drink liquids here. She is currently abdominal pain free. She has a foreign body sensation in her throat however she does any history of food impaction. History of similar symptoms in the past has never been seen by a physician  Past Medical History  Diagnosis Date  . Arthritis   . Atrial fibrillation   . CHF (congestive heart failure)   . Rectal prolapse     Past Surgical History  Procedure Date  . Total hip arthroplasty   . Arthroscopic repair pcl     x2    Family History  Problem Relation Age of Onset  . Stroke Father   . Brain cancer Mother     History  Substance Use Topics  . Smoking status: Never Smoker   . Smokeless tobacco: Not on file  . Alcohol Use: No    OB History    Grav Para Term Preterm Abortions TAB SAB Ect Mult Living                  Review of Systems  Gastrointestinal: Positive for vomiting.  All other systems reviewed and are negative.    Allergies  Review of patient's allergies indicates no known allergies.  Home Medications   Current Outpatient Rx  Name  Route  Sig  Dispense  Refill  . CARVEDILOL 6.25 MG PO TABS      TAKE 1 TABLET BY MOUTH TWICE A DAY   60 tablet   6     PT IS OUT OF REFILLS   . ONE-DAILY MULTI VITAMINS PO TABS   Oral   Take 1 tablet by mouth daily.           .  WARFARIN SODIUM 2.5 MG PO TABS   Oral   Take 2.5-3.75 mg by mouth daily. Pt takes 2.5 mg on Sunday and Thursday. On Monday,tuesday,wednesday,friday,saturday pt takes 3.75 mg ( 1.5 tablets)           BP 149/79  Pulse 79  Temp 97.8 F (36.6 C) (Oral)  Resp 18  SpO2 100%  Physical Exam  Nursing note and vitals reviewed. Constitutional: She is oriented to person, place, and time. She appears well-developed and well-nourished.  Non-toxic appearance. No distress.  HENT:  Head: Normocephalic and atraumatic.  Eyes: Conjunctivae normal, EOM and lids are normal. Pupils are equal, round, and reactive to light.  Neck: Normal range of motion. Neck supple. No tracheal deviation present. No mass present.  Cardiovascular: Normal rate, regular rhythm and normal heart sounds.  Exam reveals no gallop.   No murmur heard. Pulmonary/Chest: Effort normal and breath sounds normal. No stridor. No respiratory distress. She has no decreased breath sounds. She has no wheezes. She has no rhonchi. She has no rales.  Abdominal: Soft. Normal appearance and bowel sounds are normal. She exhibits no distension. There  is no tenderness. There is no rebound and no CVA tenderness.  Musculoskeletal: Normal range of motion. She exhibits no edema and no tenderness.  Neurological: She is alert and oriented to person, place, and time. She has normal strength. No cranial nerve deficit or sensory deficit. GCS eye subscore is 4. GCS verbal subscore is 5. GCS motor subscore is 6.  Skin: Skin is warm and dry. No abrasion and no rash noted.  Psychiatric: She has a normal mood and affect. Her speech is normal and behavior is normal.    ED Course  Procedures (including critical care time)   Labs Reviewed  CBC WITH DIFFERENTIAL  COMPREHENSIVE METABOLIC PANEL  LIPASE, BLOOD   No results found.   No diagnosis found.    MDM  Patient given GI cocktail and water here but was unable to swallow. She'll be admitted to the  hospital for further evaluation of this        Toy Baker, MD 05/22/12 1525

## 2012-05-21 NOTE — ED Notes (Signed)
Pt denies nausea, states sometimes when she swallows, she feels like something is stuck in her throat and she vomits.  Pt states this has been happening intermittently over the past year but did not let her family know until today because she was unable to swallow her medications today.

## 2012-05-21 NOTE — Progress Notes (Signed)
Patient received as admission from the emergency room.  Patient is awake, alert and oriented x3.  Patient does not complain of any pain at this time.  Assessed for questions regarding hospitalization. No further questions at this time.  Call bell within reach.  Patient verbalizes use of call bell, bed in low position, bed alarm on for safety.  Will continue to monitor.

## 2012-05-21 NOTE — ED Notes (Signed)
Family reports pt was throwing up Saturday and all day Monday. Pt reports she lives alone. Pt reports she is having difficulty swallowing. Hx of afib on coumadin. Last time able to keep food down Sunday. Pt reports "feels like there is something in my throat".

## 2012-05-21 NOTE — H&P (Signed)
[facility] History and Physical  Lindsey Ferguson XBM:[id number] DOB: May 20, 1920 DOA: 05/21/2012   PCP: Lemont Fillers., NP    Chief Complaint:  Chief Complaint  Patient presents with  . Sore Throat  . Emesis     HPI: Lindsey Ferguson is a 78 y.o. female with past medical history of congestive heart failure, atrial fibrillation, hiatal hernia who presents to [facility] long emergency room today at the suggestion of her nephew after she has been unable to eat for the past 3 days. The patient describes that the food will go in and she will have a bulging of her neck and then shortly after the food would come back. The patient has been giving a GI cocktail in the emergency room and she has vomited a GI cocktail back. I was able to review old CT angiogram of abdomen and pelvis from April 2011 and at that time the patient had a moderate hiatal hernia . According to the nephew the patient has had intermittent nausea and vomiting for the past few months, she's been losing weight, and he is worried that she is unable to take her prescribed medications. The patient denies any fevers chills, severe chest pain.  Review of Systems: The patient denies  vision loss, decreased hearing, hoarseness, chest pain, syncope, dyspnea on exertion, peripheral edema, balance deficits, hemoptysis, abdominal pain, melena, hematochezia, hematuria, incontinence, genital sores, muscle weakness, suspicious skin lesions, transient blindness, difficulty walking, depression, unusual weight change, abnormal bleeding, enlarged lymph nodes, angioedema, and breast masses.    Past Medical History  Diagnosis Date  . Arthritis   . Atrial fibrillation   . CHF (congestive heart failure)   . Rectal prolapse    Past Surgical History  Procedure Date  . Total hip arthroplasty   . Arthroscopic repair pcl     x2  . Hernia repair with mesh 2011   Social History:  reports that she has never smoked. She has never used smokeless  tobacco. She reports that she does not drink alcohol or use illicit drugs. Patient lives alone is independent and she is independent with her activities of daily living  No Known Allergies  Family History  Problem Relation Age of Onset  . Stroke Father   . Brain cancer Mother     Prior to Admission medications   Medication Sig Start Date End Date Taking? Authorizing Provider  carvedilol (COREG) 6.25 MG tablet TAKE 1 TABLET BY MOUTH TWICE A DAY 03/11/12  Yes Pricilla Riffle, MD  Multiple Vitamin (MULTIVITAMIN) tablet Take 1 tablet by mouth daily.     Yes Historical Provider, MD  warfarin (COUMADIN) 2.5 MG tablet Take 2.5-3.75 mg by mouth daily. Pt takes 2.5 mg on Sunday and Thursday. On Monday,tuesday,wednesday,friday,saturday pt takes 3.75 mg ( 1.5 tablets)   Yes Historical Provider, MD   Physical Exam: Filed Vitals:   05/21/12 1135 05/21/12 1335  BP: 149/79 158/92  Pulse: 79 79  Temp: 97.8 F (36.6 C)   TempSrc: Oral   Resp: 18 18  SpO2: 100% 100%     General:  Alert and oriented x3, head normocephalic/atraumatic  Eyes: Pupil equal round react to light accommodation, extraocular movement is intact, sclera anicteric conjunctiva pale  ENT: No clear pharyngeal exudates, no palpable masses in the neck  Neck: No jugular venous distention  Cardiovascular: Irregularly irregular, no murmurs rubs or gallops  Respiratory: Clear to auscultation bilaterally without wheezes rhonchi crackles  Abdomen: Distended, soft, nontender, bowel sounds present  Skin: Pale  dry without rashes  Musculoskeletal: Intact muscle bulk and tone appropriate for age  Psychiatric: Euthymic  Neurologic: Cranial nerves 2-12 intact, strength 5 out of 5 in all 4 extremity is, sensation intact  Labs on Admission:  Basic Metabolic Panel:  Lab 05/21/12 1610  NA 145  K 3.3*  CL 111  CO2 24  GLUCOSE 114*  BUN 19  CREATININE 0.80  CALCIUM 10.6*  MG --  PHOS --   Liver Function Tests:  Lab  05/21/12 1255  AST 26  ALT 16  ALKPHOS 67  BILITOT 1.1  PROT 7.7  ALBUMIN 3.9    Lab 05/21/12 1255  LIPASE 24  AMYLASE --   No results found for this basename: AMMONIA:5 in the last 168 hours CBC:  Lab 05/21/12 1255  WBC 9.1  NEUTROABS 7.7  HGB 14.3  HCT 42.3  MCV 95.7  PLT 187   Cardiac Enzymes: No results found for this basename: CKTOTAL:5,CKMB:5,CKMBINDEX:5,TROPONINI:5 in the last 168 hours  BNP (last 3 results) No results found for this basename: PROBNP:3 in the last 8760 hours CBG: No results found for this basename: GLUCAP:5 in the last 168 hours  Radiological Exams on Admission: Dg Chest 2 View  05/21/2012  *RADIOLOGY REPORT*  Clinical Data:  pain difficulty swallowing  CHEST - 2 VIEW  Comparison: 09/22/2009  Findings: There is moderate to marked cardiac enlargement.  This is similar to previous exam.  The patient has a moderate-sized hiatal hernia.  No pleural effusion or edema.  Within the right lower lobe there is an indeterminate nodular opacity measuring 1.7 cm.  No airspace consolidation identified. A scoliosis deformity affects the thoracic spine.  IMPRESSION:  1.  Indeterminant nodular opacity in the right base measures 1.7 cm.  Advise further evaluation with noncontrast CT of the chest. 2.  Stable cardiac enlargement. 3.  Hiatal hernia.   Original Report Authenticated By: Signa Kell, M.D.      Assessment/Plan Principal Problem:  *Hiatal hernia with obstruction but no gangrene Active Problems:  ATRIAL FIBRILLATION WITH RAPID VENTRICULAR RESPONSE  CHF  Dysphagia  Hypokalemia  Hypercalcemia  Chronic combined systolic and diastolic CHF (congestive heart failure)  Moderate to severe pulmonary hypertension  Mitral regurgitation   1. Dysphagia-differential diagnosis is hiatal hernia with obstruction versus esophageal dysmotility versus large Zenker's diverticulum. Patient currently is unable to take any by mouth's. The patient will be admitted to the  hospital and start IV fluids. We shall obtain a barium evaluation of the esophagus to see if the dysphagia is related to the Zenker or to the hiatal hernia. Surgical or GI consultation will be obtained as needed after the initial evaluation 2. hypokalemia, mild dehydration, mild hypercalcemia-due to poor by mouth intake-start IV fluids with potassium 3. atrial fibrillation-rate controlled on a low dose beta blocker. We'll continue some IV beta blockers while in the hospital. We'll place the Coumadin on hold for now and in any case the patient is having a therapeutic INR for now.  4. history of chronic diastolic and systolic congestive heart failure with moderate pulmonary hypertension-patient is currently euvolemic and she is also minimally symptomatic. She had a recent cardiac stress test which was within normal limits. If she needs surgical repair of her hernia she is cleared medically to have that procedure.     Code Status: full  Family Communication: nephew  Disposition Plan: home   Time spent: 1 hour  Javious Hallisey [facility] Pager [phone]  If 7PM-7AM, please contact night-coverage [email or web address] Password [email or web address]  05/21/2012, 3:49 PM

## 2012-05-21 NOTE — Progress Notes (Signed)
Barium esophagram shows complete obstruction -I have called [facility] for EGD Marlet Korte

## 2012-05-21 NOTE — Consult Note (Signed)
[facility] Consultation  Referring Provider: Dr Lavera Guise Primary Care Physician:  Lemont Fillers., NP Primary Gastroenterologist:  Dr.Kaplan  Reason for Consultation:  Acute onset dysphagia  HPI: Lindsey Ferguson is a 78 y.o. female who gives scatchy history of difficult swallowing and acute episode of complete inability to swallow more than 24 hours ago without associated pain or hematemesis. She has a moderate size HH documented on prior chest  X-rays, but never had an evaluation with UGI or EGD. She denies heartburn. Has occasional cough. Followed by cardiology for CHF, atrial fibrillation   Past Medical History  Diagnosis Date  . Arthritis   . Atrial fibrillation   . CHF (congestive heart failure)   . Rectal prolapse     Past Surgical History  Procedure Date  . Total hip arthroplasty   . Arthroscopic repair pcl     x2  . Hernia repair with mesh 2011    Prior to Admission medications   Medication Sig Start Date End Date Taking? Authorizing Provider  carvedilol (COREG) 6.25 MG tablet TAKE 1 TABLET BY MOUTH TWICE A DAY 03/11/12  Yes Pricilla Riffle, MD  Multiple Vitamin (MULTIVITAMIN) tablet Take 1 tablet by mouth daily.     Yes Historical Provider, MD  warfarin (COUMADIN) 2.5 MG tablet Take 2.5-3.75 mg by mouth daily. Pt takes 2.5 mg on Sunday and Thursday. On Monday,tuesday,wednesday,friday,saturday pt takes 3.75 mg ( 1.5 tablets)   Yes Historical Provider, MD    Current Facility-Administered Medications  Medication Dose Route Frequency Provider Last Rate Last Dose  . dextrose 5 % and 0.45 % NaCl with KCl 40 mEq/L infusion   Intravenous Continuous Sorin Luanne Bras, MD 75 mL/hr at 05/21/12 1715 75 mL/hr at 05/21/12 1715  . influenza  inactive virus vaccine (FLUZONE/FLUARIX) injection 0.5 mL  0.5 mL Intramuscular Tomorrow-1000 Toy Baker, MD      . metoprolol (LOPRESSOR) injection 2.5 mg  2.5 mg Intravenous Q12H Sorin Luanne Bras, MD      . ondansetron [facility]) tablet 4  mg  4 mg Oral Q6H PRN Sorin Luanne Bras, MD       Or  . ondansetron (ZOFRAN) injection 4 mg  4 mg Intravenous Q6H PRN Sorin Luanne Bras, MD      . pantoprazole (PROTONIX) injection 40 mg  40 mg Intravenous Q24H Sorin Luanne Bras, MD        Allergies as of 05/21/2012  . (No Known Allergies)    Family History  Problem Relation Age of Onset  . Stroke Father   . Brain cancer Mother     History   Social History  . Marital Status: Widowed    Spouse Name: N/A    Number of Children: N/A  . Years of Education: N/A   Occupational History  . retired    Social History Main Topics  . Smoking status: Never Smoker   . Smokeless tobacco: Never Used  . Alcohol Use: No  . Drug Use: No  . Sexually Active: No   Other Topics Concern  . Not on file   Social History Narrative  . No narrative on file    Review of Systems: Gen: Denies any fever, chills, sweats, anorexia, fatigue, weakness, malaise, weight loss, and sleep disorder CV: Denies chest pain, angina, palpitations, syncope, orthopnea, PND, peripheral edema, and claudication. Resp: Denies dyspnea at rest, dyspnea with exercise, cough, sputum, wheezing, coughing up blood, and pleurisy. GI: intermittent dysphagia GU : Denies urinary burning, blood in urine, urinary frequency,  urinary hesitancy, nocturnal urination, and urinary incontinence. MS: Denies joint pain, limitation of movement, and swelling, stiffness, low back pain, extremity pain. Denies muscle weakness, cramps, atrophy.   Psych: Denies depression, anxiety, memory loss, suicidal ideation, hallucinations, paranoia, and confusion. Neuro:  Denies any headaches, dizziness, paresthesias.  Physical Exam: Vital signs in last 24 hours: Temp:  [97.8 F (36.6 C)] 97.8 F (36.6 C) (12/31 1135) Pulse Rate:  [79] 79  (12/31 1335) Resp:  [18] 18  (12/31 1335) BP: (149-158)/(79-92) 158/92 mmHg (12/31 1335) SpO2:  [100 %] 100 % (12/31 1335) Last BM Date: 05/17/12 (patient unsure) General:    Alert,  Small thin woman, alert and oriented, continuously expectorating her saliva and barium Head:  Normocephalic and atraumatic. Eyes:  Sclera clear, no icterus.   Conjunctiva pink.. Mouth:  No deformity or lesions.  Oropharynx pink & moist. Neck:  Supple; no masses or thyromegaly. Lungs:  Clear throughout to auscultation.   No wheezes, crackles, or rhonchi. No acute distress., prominent thoracic kyphosis Heart:  Irregular rate and rhythm; no murmurs, clicks, rubs,  or gallops. Abdomen:  Soft, nontender and nondistended. No masses, hepatosplenomegaly or hernias noted. Normal bowel sounds, without guarding, and without rebound.   Rectal: not done  Msk:  Symmetrical without gross deformities. Normal posture. Pulses:  Normal pulses noted. Extremities:  Without clubbing or edema. Neurologic:  Alert and  oriented x4;  grossly normal neurologically. Skin:  Intact without significant lesions or rashes. .  Intake/Output from previous day:   Intake/Output this shift:    Lab Results:  Basename 05/21/12 1255  WBC 9.1  HGB 14.3  HCT 42.3  PLT 187   BMET  Basename 05/21/12 1255  NA 145  K 3.3*  CL 111  CO2 24  GLUCOSE 114*  BUN 19  CREATININE 0.80  CALCIUM 10.6*   LFT  Basename 05/21/12 1255  PROT 7.7  ALBUMIN 3.9  AST 26  ALT 16  ALKPHOS 67  BILITOT 1.1  BILIDIR --  IBILI --   PT/INR  Basename 05/21/12 1255  LABPROT 25.2*  INR 2.42*   Hepatitis Panel No results found for this basename: HEPBSAG,HCVAB,HEPAIGM,HEPBIGM in the last 72 hours  Studies/Results: Dg Chest 2 View  05/21/2012  *RADIOLOGY REPORT*  Clinical Data:  pain difficulty swallowing  CHEST - 2 VIEW  Comparison: 09/22/2009  Findings: There is moderate to marked cardiac enlargement.  This is similar to previous exam.  The patient has a moderate-sized hiatal hernia.  No pleural effusion or edema.  Within the right lower lobe there is an indeterminate nodular opacity measuring 1.7 cm.  No airspace  consolidation identified. A scoliosis deformity affects the thoracic spine.  IMPRESSION:  1.  Indeterminant nodular opacity in the right base measures 1.7 cm.  Advise further evaluation with noncontrast CT of the chest. 2.  Stable cardiac enlargement. 3.  Hiatal hernia.   Original Report Authenticated By: Signa Kell, M.D.    Dg Esophagus  05/21/2012  *RADIOLOGY REPORT*  Clinical Data: Dysphagia.  ESOPHOGRAM/BARIUM SWALLOW  Technique:  Single contrast examination was performed using thin barium.  Fluoroscopy time:  0.3 minutes.  Comparison:  None  Findings:  There is a distal esophageal obstruction with an irregular filling defect.  This is most likely a food impaction. Cannot exclude a distal stricture or mass.  No barium passed into the stomach.  IMPRESSION: Distal esophageal obstruction.   Original Report Authenticated By: Rudie Meyer, M.D.      Previous Endoscopies: Flex sigm 2011 Dr  Arlyce Dice- diverticulosis  Impression / Plan: Acute onset dysphagia to solids and liquids  With barium  Not able to progress beyond distal esophagus, no clear stricture, mildly dilated esophagus proximal to the obstruction, suspect food impaction but  cannot r/o neoplasm or acute angulation of the distal esophagus due to large hiatal hernia.( doubt volvulus)The esophagus is filled with barium and therefore it is not feasible to endoscope at this point the pt without risk of aspiration or perforation  Hx of hiatal hernia and ? esophageal dysmotility,( presbyesophagus) Plan: kep npo till am, CXR in am to assess barium evacuation, Possible EGD tomorrow if most of the barium has cleared Head of bed elevation 30 degrees to prevent aspiration Trial of Glucagon .5 mg IV x1, if not effective, try Diazepam 2 mg IV, watch vital signs Dr Elnoria Howard on call tomorrow, will decide on timing of the EGD   LOS: 0 days   Lina Sar  05/21/2012, 6:28 PM

## 2012-05-21 NOTE — ED Notes (Signed)
Soon after drinking GI cocktail, pt states most of it came back up. Pt given a small sip of water and pt states it feels like it gets halfway down her throat and then comes back up.  Pt states she is not vomiting, she just feels like nothing will pass through her throat completely.  Dr. Freida Busman made aware.

## 2012-05-22 ENCOUNTER — Encounter (HOSPITAL_COMMUNITY)
Admission: EM | Disposition: A | Discharge status: Home / Self Care | Payer: Self-pay | Source: Home / Self Care | Attending: Internal Medicine

## 2012-05-22 ENCOUNTER — Encounter (HOSPITAL_COMMUNITY): Payer: Self-pay | Admitting: Gastroenterology

## 2012-05-22 ENCOUNTER — Inpatient Hospital Stay (HOSPITAL_COMMUNITY): Payer: Medicare Other

## 2012-05-22 DIAGNOSIS — K311 Adult hypertrophic pyloric stenosis: Secondary | ICD-10-CM | POA: Diagnosis present

## 2012-05-22 HISTORY — PX: ESOPHAGOGASTRODUODENOSCOPY: SHX5428

## 2012-05-22 LAB — BASIC METABOLIC PANEL
CO2: 23 mEq/L (ref 19–32)
Glucose, Bld: 147 mg/dL — ABNORMAL HIGH (ref 70–99)
Potassium: 3.6 mEq/L (ref 3.5–5.1)
Sodium: 143 mEq/L (ref 135–145)

## 2012-05-22 LAB — CBC
Hemoglobin: 13.1 g/dL (ref 12.0–15.0)
MCH: 32 pg (ref 26.0–34.0)
MCV: 99 fL (ref 78.0–100.0)
RBC: 4.1 MIL/uL (ref 3.87–5.11)
WBC: 11.7 10*3/uL — ABNORMAL HIGH (ref 4.0–10.5)

## 2012-05-22 LAB — PROTIME-INR: Prothrombin Time: 25.3 seconds — ABNORMAL HIGH (ref 11.6–15.2)

## 2012-05-22 SURGERY — EGD (ESOPHAGOGASTRODUODENOSCOPY)
Anesthesia: Moderate Sedation

## 2012-05-22 MED ORDER — WARFARIN - PHARMACIST DOSING INPATIENT: Freq: Every day | Status: DC

## 2012-05-22 MED ORDER — SODIUM CHLORIDE 0.9 % IV SOLN: INTRAVENOUS | Status: DC

## 2012-05-22 MED ORDER — FENTANYL CITRATE 0.05 MG/ML IJ SOLN
INTRAMUSCULAR | Status: DC | PRN
  Administered 2012-05-22 (×2): 12.5 ug via INTRAVENOUS

## 2012-05-22 MED ORDER — CARVEDILOL 6.25 MG PO TABS
6.2500 mg | ORAL_TABLET | Freq: Two times a day (BID) | ORAL | Status: DC
  Administered 2012-05-22 – 2012-05-23 (×2): 6.25 mg via ORAL
  Filled 2012-05-22 (×4): qty 1

## 2012-05-22 MED ORDER — WARFARIN SODIUM 2.5 MG PO TABS
2.5000 mg | ORAL_TABLET | Freq: Once | ORAL | Status: AC
  Administered 2012-05-22: 2.5 mg via ORAL
  Filled 2012-05-22: qty 1

## 2012-05-22 MED ORDER — MIDAZOLAM HCL 10 MG/2ML IJ SOLN
INTRAMUSCULAR | Status: DC | PRN
  Administered 2012-05-22 (×3): 1 mg via INTRAVENOUS

## 2012-05-22 NOTE — Progress Notes (Addendum)
ANTICOAGULATION CONSULT NOTE - Initial Consult  Pharmacy Consult for Warfarin Indication: atrial fibrillation  No Known Allergies  Patient Measurements:   Vital Signs: Temp: 98.7 F (37.1 C) (01/01 1448) Temp src: Axillary (01/01 1448) BP: 149/74 mmHg (01/01 1448) Pulse Rate: 82  (01/01 1448)  Labs:  Basename 05/22/12 0500 05/21/12 1255  HGB 13.1 14.3  HCT 40.6 42.3  PLT 161 187  APTT -- --  LABPROT 25.3* 25.2*  INR 2.43* 2.42*  HEPARINUNFRC -- --  CREATININE 0.77 0.80  CKTOTAL -- --  CKMB -- --  TROPONINI -- --    The CrCl is unknown because both a height and weight (above a minimum accepted value) are required for this calculation.   Medical History: Past Medical History  Diagnosis Date  . Arthritis   . Atrial fibrillation   . CHF (congestive heart failure)   . Rectal prolapse     Medications:  Scheduled:    . carvedilol  6.25 mg Oral BID WC  . [COMPLETED] glucagon  0.5 mg Intravenous Once  . influenza  inactive virus vaccine  0.5 mL Intramuscular Tomorrow-1000  . pantoprazole (PROTONIX) IV  40 mg Intravenous Q24H  . [DISCONTINUED] diazepam  2 mg Intravenous Once  . [DISCONTINUED] metoprolol  2.5 mg Intravenous Q12H   Infusions:    . sodium chloride    . [DISCONTINUED] dextrose 5 % and 0.45 % NaCl with KCl 40 mEq/L 75 mL/hr at 05/22/12 0559   PRN: [EXPIRED] diazepam, ondansetron (ZOFRAN) IV, ondansetron, [DISCONTINUED] fentaNYL, [DISCONTINUED] midazolam  Assessment:  79 yo F on chronic anticoagulation for A.fib, admitted 12/31 with acute onset dysphagia.  EGD on 1/1 showed food impaction which was removed pyloric stenosis which was dilated.  Patient has been started on a CLD and is to resume coumadin per pharmacy.    INR was therapeutic on admission 2.42, and remains therapeutic today at 2.43.  No coumadin given last night.    CBC wnl, no bleeding or complications reported  Coumadin dose PTA =3.75mg  daily except 2.5mg  on Sun and Thurs  Goal of  Therapy:  INR 2-3 Monitor platelets by anticoagulation protocol: Yes   Plan:  1.) Given limited PO intake the last 2 days and INR remaining therapeutic will give lower home dose of 2.5 mg tonight 2.) Daily PT/INR  Lindsey Ferguson, Loma Messing 05/22/2012,4:27 PM

## 2012-05-22 NOTE — Progress Notes (Signed)
[facility] PROGRESS NOTE  Lindsey Ferguson VWU:[id number] DOB: 1919-10-06 DOA: 05/21/2012 PCP: Lemont Fillers., NP  Brief narrative 79 year old female with history of CHF, A. fib, hiatal hernia who was admitted with inability to eat for past 3 days with intermittent nausea and vomiting for past few months.  Assessment/Plan  Dysphagia Patient made n.p.o. been swallow showed distally sufficient obstruction with likely food impaction. she was seen by GI Dr. hung who performed EGD today showing food impaction in the Esophagus which was removed and a pyloric stenosis which was dilated as well. -She tolerated procedure well and has been started on clear liquids and will be monitored. If tolerated well and stable on advanced diet she can be discharged home tomorrow. Plan discussed with Dr. Elnoria Howard.  Hypokalemia and mild dehydration. Improved on IV fluids  Atrial fibrillation resume Coumadin and beta blocker.  History of chronic diastolic and systolic CHF Euvolemic.  Code Status: Full Family Communication: Discussed with daughter at bedside Disposition Plan: Home once tolerated regular diet. likely tomorrow   Consultants:  Dr. Elnoria Howard ( GI)  Procedures:  EGD done on 1/1  Antibiotics:  None  HPI/Subjective: Patient seen after returning from EGD. Denies any symptoms  Objective: Filed Vitals:   05/22/12 1121 05/22/12 1130 05/22/12 1140 05/22/12 1448  BP: 149/85 144/93 143/93 149/74  Pulse: 97 92 80 82  Temp:    98.7 F (37.1 C)  TempSrc:    Axillary  Resp: 28 25 24 22   SpO2: 95% 95% 92% 93%    Intake/Output Summary (Last 24 hours) at 05/22/12 1605 Last data filed at 05/22/12 1400  Gross per 24 hour  Intake   1620 ml  Output      0 ml  Net   1620 ml   There were no vitals filed for this visit.  Exam:   General:  Elderly female in no acute distress, Sleepy  HEENT: No pallor, moist oral mucosa  Cardiovascular: Normal S1 and S2, no murmurs  Respiratory:  Clear to auscultation bilaterally, no added sounds  Abdomen: Soft, nontender, nondistended, bowel sounds present  Extremities: Warm, no edema  CNS: Alert to commands and answers few questions .sleepy post anesthesia  Data Reviewed: Basic Metabolic Panel:  Lab 05/22/12 2956 05/21/12 1255  NA 143 145  K 3.6 3.3*  CL 113* 111  CO2 23 24  GLUCOSE 147* 114*  BUN 16 19  CREATININE 0.77 0.80  CALCIUM 9.9 10.6*  MG -- --  PHOS -- --   Liver Function Tests:  Lab 05/21/12 1255  AST 26  ALT 16  ALKPHOS 67  BILITOT 1.1  PROT 7.7  ALBUMIN 3.9    Lab 05/21/12 1255  LIPASE 24  AMYLASE --   No results found for this basename: AMMONIA:5 in the last 168 hours CBC:  Lab 05/22/12 0500 05/21/12 1255  WBC 11.7* 9.1  NEUTROABS -- 7.7  HGB 13.1 14.3  HCT 40.6 42.3  MCV 99.0 95.7  PLT 161 187   Cardiac Enzymes: No results found for this basename: CKTOTAL:5,CKMB:5,CKMBINDEX:5,TROPONINI:5 in the last 168 hours BNP (last 3 results) No results found for this basename: PROBNP:3 in the last 8760 hours CBG: No results found for this basename: GLUCAP:5 in the last 168 hours  No results found for this or any previous visit (from the past 240 hour(s)).   Studies: Dg Chest 2 View  05/22/2012  *RADIOLOGY REPORT*  Clinical Data: Esophageal obstruction.  CHEST - 2 VIEW  Comparison: Chest x-ray and esophagram dated  05/21/2012.  Findings: Evolving infiltrates are now evident in both the right upper lobe and right lower lobe.  There remains barium in the mid to distal esophagus with no barium identified in the stomach. Findings are consistent with aspiration pneumonia and continued obstruction of the distal esophagus.  Stable cardiomegaly.  No significant pleural effusions.  Stable degenerative changes and kyphosis of the spine.  IMPRESSION: Findings consistent with aspiration and evolving associated pneumonia in the right upper lobe and right lower lobe.  There remains evidence of a probable  high-grade obstruction of the distal esophagus.   Original Report Authenticated By: Irish Lack, M.D.    Dg Chest 2 View  05/21/2012  *RADIOLOGY REPORT*  Clinical Data:  pain difficulty swallowing  CHEST - 2 VIEW  Comparison: 09/22/2009  Findings: There is moderate to marked cardiac enlargement.  This is similar to previous exam.  The patient has a moderate-sized hiatal hernia.  No pleural effusion or edema.  Within the right lower lobe there is an indeterminate nodular opacity measuring 1.7 cm.  No airspace consolidation identified. A scoliosis deformity affects the thoracic spine.  IMPRESSION:  1.  Indeterminant nodular opacity in the right base measures 1.7 cm.  Advise further evaluation with noncontrast CT of the chest. 2.  Stable cardiac enlargement. 3.  Hiatal hernia.   Original Report Authenticated By: Signa Kell, M.D.    Dg Esophagus  05/21/2012  *RADIOLOGY REPORT*  Clinical Data: Dysphagia.  ESOPHOGRAM/BARIUM SWALLOW  Technique:  Single contrast examination was performed using thin barium.  Fluoroscopy time:  0.3 minutes.  Comparison:  None  Findings:  There is a distal esophageal obstruction with an irregular filling defect.  This is most likely a food impaction. Cannot exclude a distal stricture or mass.  No barium passed into the stomach.  IMPRESSION: Distal esophageal obstruction.   Original Report Authenticated By: Rudie Meyer, M.D.     Scheduled Meds:   . influenza  inactive virus vaccine  0.5 mL Intramuscular Tomorrow-1000  . metoprolol  2.5 mg Intravenous Q12H  . pantoprazole (PROTONIX) IV  40 mg Intravenous Q24H   Continuous Infusions:   . sodium chloride    . dextrose 5 % and 0.45 % NaCl with KCl 40 mEq/L 75 mL/hr at 05/22/12 0559      Time spent: 25 minutes    Eddie North  [facility] Pager [phone] If 8PM-8AM, please contact night-coverage at [email or web address], password [facility] 05/22/2012, 4:05 PM  LOS: 1 day

## 2012-05-22 NOTE — H&P (View-Only) (Signed)
The patient feels well at this time.  The repeat CXR is pending, but she reports spitting up a significant amount of fluid last evening.  Pending the results of the CXR I will make a determination about the EGD.  Hopefully I can perform the procedure today.   

## 2012-05-22 NOTE — Progress Notes (Signed)
Pt is refusing evening protonix. Pt is refusing further assessment and asked to be left alone.  She is disoriented to situation at this time.  I will reassess soon as patient has been this way since procedure earlier today.

## 2012-05-22 NOTE — Progress Notes (Signed)
The patient feels well at this time.  The repeat CXR is pending, but she reports spitting up a significant amount of fluid last evening.  Pending the results of the CXR I will make a determination about the EGD.  Hopefully I can perform the procedure today.

## 2012-05-22 NOTE — Interval H&P Note (Signed)
History and Physical Interval Note:  05/22/2012 10:41 AM  Lindsey Ferguson  has presented today for surgery, with the diagnosis of Dysphagia  The various methods of treatment have been discussed with the patient and family. After consideration of risks, benefits and other options for treatment, the patient has consented to  Procedure(s) (LRB) with comments: ESOPHAGOGASTRODUODENOSCOPY (EGD) (N/A) - Rm 1512 as a surgical intervention .  The patient's history has been reviewed, patient examined, no change in status, stable for surgery.  I have reviewed the patient's chart and labs.  Questions were answered to the patient's satisfaction.     Lindsey Ferguson  I reviewed the CXR and discussed the type of barium used with a radiology technician.  There does not appear to be any change from yesterday's volume of barium.  On a small amount of thin barium was provided.  In light of the situation I will perform the EGD.  I did let the patient know that aspiration of barium is a significant risk, but I do not know of any other way to alleviate the situation.  My plan is to quickly suction the barium and thin out the barium with some water.  Afterwards I will focus on the obstruction.  She acknowledges the risk and wishes to proceed.  I also called her son-in-law at [phone] and left a message for him.

## 2012-05-23 ENCOUNTER — Telehealth: Payer: Self-pay | Admitting: Family

## 2012-05-23 ENCOUNTER — Encounter (HOSPITAL_COMMUNITY): Payer: Self-pay | Admitting: Gastroenterology

## 2012-05-23 DIAGNOSIS — E876 Hypokalemia: Secondary | ICD-10-CM

## 2012-05-23 DIAGNOSIS — K311 Adult hypertrophic pyloric stenosis: Secondary | ICD-10-CM | POA: Diagnosis present

## 2012-05-23 DIAGNOSIS — Z7901 Long term (current) use of anticoagulants: Secondary | ICD-10-CM

## 2012-05-23 LAB — PROTIME-INR
INR: 2.88 — ABNORMAL HIGH (ref 0.00–1.49)
Prothrombin Time: 28.7 seconds — ABNORMAL HIGH (ref 11.6–15.2)

## 2012-05-23 MED ORDER — WARFARIN SODIUM 2.5 MG PO TABS
2.5000 mg | ORAL_TABLET | Freq: Once | ORAL | Status: DC
  Filled 2012-05-23: qty 1

## 2012-05-23 MED ORDER — PANTOPRAZOLE SODIUM 40 MG PO TBEC: 40.0000 mg | DELAYED_RELEASE_TABLET | Freq: Every day | ORAL | Status: DC

## 2012-05-23 NOTE — Op Note (Signed)
[facility] [address] [place], [address]   OPERATIVE PROCEDURE REPORT  PATIENT: Lindsey Ferguson, Lindsey Ferguson  MR#: [id number] BIRTHDATE: 08-12-1919  GENDER: Female ENDOSCOPIST: Jeani Hawking, MD ASSISTANT:   Olene Craven, technician and Anthony Sar, RN PROCEDURE DATE: 05/22/2012 PROCEDURE:   EGD w/ balloon dilation ASA CLASS:   Class II INDICATIONS:Food impaction. MEDICATIONS: Versed 3 mg IV and Fentanyl 25 mcg IV TOPICAL ANESTHETIC:   none  DESCRIPTION OF PROCEDURE:   After the risks benefits and alternatives of the procedure were thoroughly explained, informed consent was obtained.  The Pentax Gastroscope [id number]  endoscope was introduced through the mouth  and advanced to the second portion of the duodenum Without limitations.      The instrument was slowly withdrawn as the mucosa was fully examined.      FINDINGS: Upon initial entry into the esophagus there was barium noted in the distal esophagus.  Aggressive suctioning was performed and all the barium was removed.  The food bolus was exposed and a large portion of the food bolus was able to be removed with a [employer].  During a second attempt to remove the remaining food bolus it was able to be pushed inot the gastric lumen.  Clear exposure of a peptic stricture was identified in the setting of a 6-8 cm hiatal hernia.  The stricture was dilated up to 15 mm as the 12 and 13.5 mm diameters were too small for the stricture.  The expected mucosal tear was identified.  In the pylorus a strticture was also identified.  Using the tip of the balloon the pylorus was dilated up to 12 mm.  Unfortunately the endosocpe was not able t pass. Only after dilating up to 13.5 mm was the endoscope able to pass into the duodenum, but there was still moderate resistance.  The pyloric stenosis was ultimately dilated up to 15 mm, which allowed for easy passage into the duodenum.  No other abnormalities noted. Retroflexed  views revealed no abnormalities.     The scope was then withdrawn from the patient and the procedure terminated.  No post procedure crepitus was noted.  COMPLICATIONS: There were no complications. IMPRESSION: 1) Distal esophageal stricture s/p 15 mm balloon dilation. 2) Torturous distal esohagus. 3) 6-8 cm large hiatal hernia. 4) Pyloric stenosis s/p 15 mm balloon dilation.  RECOMMENDATIONS: 1) Clear liquid diet. 2) Continue with Protonix.   _______________________________ eSignedJeani Hawking, MD 05/22/2012 11:30 AM    PATIENT NAME:  Lindsey Ferguson, Lindsey Ferguson MR#: [id number]

## 2012-05-23 NOTE — Progress Notes (Signed)
Tolerating clear liquids, wants to go home. She had a food impaction in the esophagus, which was removed by Dr Elnoria Howard  24 hours ago. She was dilated to 15 mm. OK to discharge home on soft diet, PPI, antireflux measures.

## 2012-05-23 NOTE — Discharge Summary (Addendum)
Physician Discharge Summary  Lindsey Ferguson QIO:[id number] DOB: [birth year] DOA: 05/21/2012  PCP: Thomes Dinning at [facility] Admit date: 05/21/2012 Discharge date: 05/23/2012  Time spent:40  minutes  Recommendations for Outpatient Follow-up:   Home with outpatient GI follow up.   Discharge Diagnoses:  Principal Problem:  *Gastric outlet obstruction   Active Problems:  Dysphagia  Pyloric stenosis  Hypokalemia  Chronic combined systolic and diastolic CHF (congestive heart failure)   Discharge Condition: fair  Diet recommendation: soft diet( dysphagia level 3)   History of present illness:  Please review admission H&P note in details but in brief, 79 year old female with history of CHF, A. fib, hiatal hernia who was admitted with inability to eat for past 3 days with intermittent nausea and vomiting for past few months.         Hospital Course:  Dysphagia  Patient made n.p.o. Barium swallow showed distally sufficient obstruction with likely food impaction. she was seen by GI Dr. hung who performed EGD showing food impaction in the Esophagus which was removed and dilated and a pyloric stenosis which was dilated as well.  -She tolerated procedure well and has been started on clear liquids . She is now advanced to soft diet which he will continue on discharge. Also discharge her on daily Protonix. She is cleared for discharge by GI and will be followed up as outpatient.  Hypokalemia and mild dehydration.  Improved with IV fluids  Atrial fibrillation  resume Coumadin and beta blocker.   History of chronic diastolic and systolic CHF  Euvolemic. Follows with [facility] cardiology for her INR check as well.   Procedures:  EGD on 1/1  Consultations:  Dr. Hyman Bower ( GI)  Discharge Exam: Filed Vitals:   05/22/12 1140 05/22/12 1448 05/23/12 0214 05/23/12 0628  BP: 143/93 149/74 116/74 125/78  Pulse: 80 82 89 81  Temp:  98.7 F (37.1 C) 98.4 F (36.9 C) 97.5 F  (36.4 C)  TempSrc:  Axillary Oral Oral  Resp: 24 22 16 18   SpO2: 92% 93% 93% 95%    General: Elderly female in no acute distress HEENT: No pallor, moist oral mucosa Cardiovascular: Normal S1 and S2, no murmurs rub or gallop Respiratory: Ear to auscultation bilaterally, no added sounds Abdomen: Soft, nontender, nondistended, bowel sounds present Extremities: Warm, no edema CNS: AAO X3  Discharge Instructions     Medication List     As of 05/23/2012 10:58 AM    TAKE these medications         carvedilol 6.25 MG tablet   Commonly known as: COREG   TAKE 1 TABLET BY MOUTH TWICE A DAY      multivitamin tablet   Take 1 tablet by mouth daily.      pantoprazole 40 MG tablet   Commonly known as: PROTONIX   Take 1 tablet (40 mg total) by mouth daily.      warfarin 2.5 MG tablet   Commonly known as: COUMADIN   Take 2.5-3.75 mg by mouth daily. Pt takes 2.5 mg on Sunday and Thursday. On Monday,tuesday,wednesday,friday,saturday pt takes 3.75 mg ( 1.5 tablets)           Follow-up Information    Please follow up. (follow up with [facility] for her CHF and INR check)       Follow up with Lina Sar, MD. In 4 weeks.   Contact information:   [address] [place] [address] [phone]  The results of significant diagnostics from this hospitalization (including imaging, microbiology, ancillary and laboratory) are listed below for reference.    Significant Diagnostic Studies: Dg Chest 2 View  05/22/2012  *RADIOLOGY REPORT*  Clinical Data: Esophageal obstruction.  CHEST - 2 VIEW  Comparison: Chest x-ray and esophagram dated 05/21/2012.  Findings: Evolving infiltrates are now evident in both the right upper lobe and right lower lobe.  There remains barium in the mid to distal esophagus with no barium identified in the stomach. Findings are consistent with aspiration pneumonia and continued obstruction of the distal esophagus.  Stable cardiomegaly.   No significant pleural effusions.  Stable degenerative changes and kyphosis of the spine.  IMPRESSION: Findings consistent with aspiration and evolving associated pneumonia in the right upper lobe and right lower lobe.  There remains evidence of a probable high-grade obstruction of the distal esophagus.   Original Report Authenticated By: Irish Lack, M.D.    Dg Chest 2 View  05/21/2012  *RADIOLOGY REPORT*  Clinical Data:  pain difficulty swallowing  CHEST - 2 VIEW  Comparison: 09/22/2009  Findings: There is moderate to marked cardiac enlargement.  This is similar to previous exam.  The patient has a moderate-sized hiatal hernia.  No pleural effusion or edema.  Within the right lower lobe there is an indeterminate nodular opacity measuring 1.7 cm.  No airspace consolidation identified. A scoliosis deformity affects the thoracic spine.  IMPRESSION:  1.  Indeterminant nodular opacity in the right base measures 1.7 cm.  Advise further evaluation with noncontrast CT of the chest. 2.  Stable cardiac enlargement. 3.  Hiatal hernia.   Original Report Authenticated By: Signa Kell, M.D.    Dg Esophagus  05/21/2012  *RADIOLOGY REPORT*  Clinical Data: Dysphagia.  ESOPHOGRAM/BARIUM SWALLOW  Technique:  Single contrast examination was performed using thin barium.  Fluoroscopy time:  0.3 minutes.  Comparison:  None  Findings:  There is a distal esophageal obstruction with an irregular filling defect.  This is most likely a food impaction. Cannot exclude a distal stricture or mass.  No barium passed into the stomach.  IMPRESSION: Distal esophageal obstruction.   Original Report Authenticated By: Rudie Meyer, M.D.     Microbiology: No results found for this or any previous visit (from the past 240 hour(s)).   Labs: Basic Metabolic Panel:  Lab 05/22/12 1610 05/21/12 1255  NA 143 145  K 3.6 3.3*  CL 113* 111  CO2 23 24  GLUCOSE 147* 114*  BUN 16 19  CREATININE 0.77 0.80  CALCIUM 9.9 10.6*  MG -- --    PHOS -- --   Liver Function Tests:  Lab 05/21/12 1255  AST 26  ALT 16  ALKPHOS 67  BILITOT 1.1  PROT 7.7  ALBUMIN 3.9    Lab 05/21/12 1255  LIPASE 24  AMYLASE --   No results found for this basename: AMMONIA:5 in the last 168 hours CBC:  Lab 05/22/12 0500 05/21/12 1255  WBC 11.7* 9.1  NEUTROABS -- 7.7  HGB 13.1 14.3  HCT 40.6 42.3  MCV 99.0 95.7  PLT 161 187   Cardiac Enzymes: No results found for this basename: CKTOTAL:5,CKMB:5,CKMBINDEX:5,TROPONINI:5 in the last 168 hours BNP: BNP (last 3 results) No results found for this basename: PROBNP:3 in the last 8760 hours CBG: No results found for this basename: GLUCAP:5 in the last 168 hours     Signed:  Eddie North  [facility] 05/23/2012, 10:58 AM

## 2012-05-23 NOTE — Telephone Encounter (Signed)
Tried to call patient and she is hard of hearing and could not hear me. Nicki Guadalajara, can you generate a letter for this?

## 2012-05-23 NOTE — Progress Notes (Signed)
ANTICOAGULATION CONSULT NOTE - Initial Consult  Pharmacy Consult for Warfarin Indication: atrial fibrillation  No Known Allergies  Patient Measurements:   Vital Signs: Temp: 97.5 F (36.4 C) (01/02 0628) Temp src: Oral (01/02 0628) BP: 125/78 mmHg (01/02 0628) Pulse Rate: 81  (01/02 0628)  Labs:  Basename 05/23/12 0450 05/22/12 0500 05/21/12 1255  HGB -- 13.1 14.3  HCT -- 40.6 42.3  PLT -- 161 187  APTT -- -- --  LABPROT 28.7* 25.3* 25.2*  INR 2.88* 2.43* 2.42*  HEPARINUNFRC -- -- --  CREATININE -- 0.77 0.80  CKTOTAL -- -- --  CKMB -- -- --  TROPONINI -- -- --    The CrCl is unknown because both a height and weight (above a minimum accepted value) are required for this calculation.  Medications:  Scheduled:     . carvedilol  6.25 mg Oral BID WC  . [COMPLETED] influenza  inactive virus vaccine  0.5 mL Intramuscular Tomorrow-1000  . pantoprazole (PROTONIX) IV  40 mg Intravenous Q24H  . [COMPLETED] warfarin  2.5 mg Oral ONCE-1800  . Warfarin - Pharmacist Dosing Inpatient   Does not apply q1800  . [DISCONTINUED] metoprolol  2.5 mg Intravenous Q12H    Assessment:  79 yo F on chronic anticoagulation for A.fib, admitted 12/31 with acute onset dysphagia.    EGD on 1/1 showed food impaction which was removed pyloric stenosis which was dilated.  Coumadin resumed 1/1.  Coumadin dose PTA =3.75mg  daily except 2.5mg  on Sun and Thurs  INR was therapeutic on admission and remains so today but rising  CBC wnl, no bleeding or complications reported  Goal of Therapy:  INR 2-3 Monitor platelets by anticoagulation protocol: Yes   Plan:   Coumadin 2.5mg  today - lower than home dose given limited PO intake and rising INR  Daily PT/INR   Loralee Pacas, PharmD, BCPS Pager: [phone] 05/23/2012,8:33 AM

## 2012-05-23 NOTE — Progress Notes (Signed)
Voices understanding of d/c instructions.  No changes noted since am assessment.  Pt has no access to a computer so my chart not set up.

## 2012-05-23 NOTE — Telephone Encounter (Signed)
pls call pt to arrange a hospital follow up visit.

## 2012-05-27 ENCOUNTER — Encounter: Payer: Self-pay | Admitting: Internal Medicine

## 2012-05-27 NOTE — Telephone Encounter (Signed)
Letter mailed

## 2012-06-22 DIAGNOSIS — K311 Adult hypertrophic pyloric stenosis: Secondary | ICD-10-CM

## 2012-06-22 HISTORY — DX: Adult hypertrophic pyloric stenosis: K31.1

## 2012-07-07 ENCOUNTER — Other Ambulatory Visit: Payer: Self-pay | Admitting: Internal Medicine

## 2012-07-10 ENCOUNTER — Encounter: Payer: Self-pay | Admitting: Gastroenterology

## 2012-07-10 ENCOUNTER — Ambulatory Visit (INDEPENDENT_AMBULATORY_CARE_PROVIDER_SITE_OTHER): Payer: Medicare Other | Admitting: Gastroenterology

## 2012-07-10 VITALS — BP 120/84 | HR 68 | Ht 60.0 in | Wt 94.4 lb

## 2012-07-10 DIAGNOSIS — K311 Adult hypertrophic pyloric stenosis: Secondary | ICD-10-CM

## 2012-07-10 DIAGNOSIS — K222 Esophageal obstruction: Secondary | ICD-10-CM

## 2012-07-10 MED ORDER — PANTOPRAZOLE SODIUM 40 MG PO TBEC: 40.0000 mg | DELAYED_RELEASE_TABLET | Freq: Every day | ORAL | Status: DC

## 2012-07-10 NOTE — Patient Instructions (Addendum)
Follow up as needed We are giving you a printed prescription of Protonix today

## 2012-07-10 NOTE — Progress Notes (Signed)
07/10/2012 Lindsey Ferguson [id number] 1919/09/27   History of Present Illness: This is a 79 year old female with PMH of CHF and atrial fibrillation, on coumadin.  She was seen in consult by our group in the hospital on 12/31 for dysphagia/food bolus.  Esophogram showed distal esophageal obstruction.  EGD was performed on 1/1 by Dr. Elnoria Howard who was covering our service.  She was found to have a food bolus, which was removed.  She also had a 6-8 cm HH as well as a peptic stricture in the distal esophagus that was dilated to 15 mm.  Had tortuous esophagus and a pyloric stricture, which was dilated to 15 mm as well; then scope was easily passed into the duodenum.  The next day she was tolerating clear liquids so was discharged home on a soft diet, PPI, and anti-reflux measures.  She comes in today for follow-up of that hospital stay.  Has been doing well since her procedure.  Eating anything that she can get her hands on.  No problem swallowing.  Ran out of PPI so has not been taking it in a few weeks.  No complaints at this time.  Current Medications, Allergies, Past Medical History, Past Surgical History, Family History and Social History were reviewed in [employer] record.   Physical Exam: There were no vitals taken for this visit. General: Thin, frail, white female in no acute distress Head: Normocephalic and atraumatic Eyes:  sclerae anicteric, conjunctiva pink  Ears: Normal auditory acuity Lungs: Clear throughout to auscultation Heart: Irregularly irregular Abdomen: Soft, non-tender and non-distended. No masses, no hepatomegaly. Normal bowel sounds. Musculoskeletal: Symmetrical with no gross deformities  Extremities: No edema  Neurological: Alert oriented x 4, grossly nonfocal Psychological:  Alert and cooperative. Normal mood and affect  Assessment and Recommendations: -Peptic stricture s/p dilation, pyloric stricture s/p dilation, 6-8 cm HH, tortuous esophagus all  causing dysphagia with esophageal obstruction prior to EGD on 05/22/2012.  No further issues since procedure.   *Continue on pantoprazole 40 mg daily. *Call with recurrent swallowing issues.

## 2012-07-17 ENCOUNTER — Ambulatory Visit: Payer: Self-pay | Admitting: Internal Medicine

## 2012-07-17 ENCOUNTER — Encounter: Payer: Self-pay | Admitting: *Deleted

## 2012-07-17 ENCOUNTER — Telehealth: Payer: Self-pay | Admitting: *Deleted

## 2012-07-17 DIAGNOSIS — Z7901 Long term (current) use of anticoagulants: Secondary | ICD-10-CM

## 2012-07-17 DIAGNOSIS — I4891 Unspecified atrial fibrillation: Secondary | ICD-10-CM
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
## 2012-07-17 NOTE — Telephone Encounter (Signed)
Called and left message for dtr, Rosanne Gutting,  to stop her mothers coumadin per Dr Tenny Craw order due to patient refusing to have her INR monitored. Requested for her to call back to coumadin clinic to let us know she received this message.    Daughter called back and she received my message

## 2012-07-17 NOTE — Telephone Encounter (Signed)
This encounter was created in error - please disregard.

## 2012-08-08 ENCOUNTER — Ambulatory Visit: Payer: Self-pay | Admitting: Cardiology

## 2012-08-08 DIAGNOSIS — I4891 Unspecified atrial fibrillation: Secondary | ICD-10-CM

## 2012-08-08 DIAGNOSIS — Z7901 Long term (current) use of anticoagulants: Secondary | ICD-10-CM

## 2012-10-10 ENCOUNTER — Ambulatory Visit (INDEPENDENT_AMBULATORY_CARE_PROVIDER_SITE_OTHER): Payer: Medicare Other | Admitting: Pharmacist Clinician (PhC)/ Clinical Pharmacy Specialist

## 2012-10-10 DIAGNOSIS — Z7901 Long term (current) use of anticoagulants: Secondary | ICD-10-CM

## 2012-10-10 DIAGNOSIS — I4891 Unspecified atrial fibrillation: Secondary | ICD-10-CM

## 2012-10-10 LAB — POCT INR: INR: 2.4

## 2012-11-07 ENCOUNTER — Ambulatory Visit: Payer: Medicare Other | Admitting: Pharmacist Clinician (PhC)/ Clinical Pharmacy Specialist

## 2012-11-11 ENCOUNTER — Ambulatory Visit: Payer: Medicare Other | Admitting: Cardiology

## 2012-11-11 ENCOUNTER — Ambulatory Visit: Payer: Medicare Other | Admitting: Pharmacist Clinician (PhC)/ Clinical Pharmacy Specialist

## 2012-11-13 ENCOUNTER — Encounter: Payer: Self-pay | Admitting: Cardiology

## 2012-11-13 ENCOUNTER — Ambulatory Visit: Payer: Medicare Other | Admitting: Pharmacist Clinician (PhC)/ Clinical Pharmacy Specialist

## 2012-11-13 ENCOUNTER — Ambulatory Visit (INDEPENDENT_AMBULATORY_CARE_PROVIDER_SITE_OTHER): Payer: Medicare Other | Admitting: Cardiology

## 2012-11-13 ENCOUNTER — Ambulatory Visit (INDEPENDENT_AMBULATORY_CARE_PROVIDER_SITE_OTHER): Payer: Medicare Other | Admitting: Pharmacist Clinician (PhC)/ Clinical Pharmacy Specialist

## 2012-11-13 ENCOUNTER — Ambulatory Visit: Payer: Medicare Other | Admitting: Cardiology

## 2012-11-13 VITALS — BP 140/80 | HR 71 | Ht 60.0 in | Wt 95.6 lb

## 2012-11-13 DIAGNOSIS — I10 Essential (primary) hypertension: Secondary | ICD-10-CM

## 2012-11-13 DIAGNOSIS — I5042 Chronic combined systolic (congestive) and diastolic (congestive) heart failure: Secondary | ICD-10-CM

## 2012-11-13 DIAGNOSIS — I2789 Other specified pulmonary heart diseases: Secondary | ICD-10-CM

## 2012-11-13 DIAGNOSIS — I4891 Unspecified atrial fibrillation: Secondary | ICD-10-CM

## 2012-11-13 DIAGNOSIS — I272 Pulmonary hypertension, unspecified: Secondary | ICD-10-CM

## 2012-11-13 DIAGNOSIS — Z7901 Long term (current) use of anticoagulants: Secondary | ICD-10-CM

## 2012-11-13 DIAGNOSIS — I509 Heart failure, unspecified: Secondary | ICD-10-CM

## 2012-11-13 LAB — POCT INR: INR: 2.2

## 2012-11-13 NOTE — Patient Instructions (Addendum)
You are doing well.  Remember - symptoms to look for:  Bleeding -- blood in stool, dark tarry stool, nosebleeds or blood in the urine  Shortness of breath with exertion or at rest or when lying down flat  Chest discomfort with rest or with exertion  Sensation of your heart going very very fast   More than usual lightheadedness, dizziness or wooziness or feel like he may pass out  As larger doing well without these symptoms will just simply followup with you in 6 months and if doing well then we can do yearly visits.

## 2012-11-14 ENCOUNTER — Encounter: Payer: Self-pay | Admitting: Cardiology

## 2012-11-24 ENCOUNTER — Encounter: Payer: Self-pay | Admitting: Cardiology

## 2012-11-24 DIAGNOSIS — I10 Essential (primary) hypertension: Secondary | ICD-10-CM | POA: Insufficient documentation

## 2012-11-24 NOTE — Assessment & Plan Note (Signed)
Stable with current medications. No active orthostatic symptoms.

## 2012-11-24 NOTE — Assessment & Plan Note (Signed)
At [facility] at half her echocardiogram on the spacing this on Dr. Tenny Craw' clinic notes. She's not having any heart failure symptoms of any PND orthopnea or edema. No need for any diuretic agent. She simply doesn't tolerate rapid A. fib.

## 2012-11-24 NOTE — Assessment & Plan Note (Signed)
Rate is relatively well controlled with carvedilol. She is not symptomatic. Nonionic find listed dose up any just to avoid any orthostatic symptoms. She remained in dilated on warfarin. I think they've decided to stick with warfarin. She'll continue discussed with Phillips Hay, Pharm.D. to see if they have any time she wants to switch over to a NOAC.

## 2012-11-24 NOTE — Progress Notes (Signed)
Patient ID: Lindsey Ferguson, female   DOB: 25-Nov-1919, 79 y.o.   MRN: [id number]  Clinic Note: HPI: Lindsey Ferguson is a 79 y.o. female with a PMH below who presents today for atrial fibrillation and mild chronic diastolic dysfunction. This is in the setting of mild hypertension as well. She is a former patient of Dr. Huston Foley, of [facility], Who transferred to our clinic history of falling out with her warfarin clinic nurse.   Interval History: I saw her back in March of this year. She was doing fairly well. Distal baseline dyspnea with exertion but she's not really all that active to begin with. She's actually fine with amount of activity she doesn't is mostly walking with a walker or uses a wheelchair. She does have unsteady gait with some balance issues. She denies any symptoms whatsoever of her atrial fibrillation no palpitations symptoms no rapid heart rates or syncope/near syncope. No lightheadedness or dizziness. No TIA or RCA symptoms. No melena, hematochezia or hematuria. No chest pain or shortness at rest or exertion beyond her baseline shortness of breath he has.   Most of her activities limited due to her knee and back arthritis. She also has significant kyphosis. No recent falls however.  Past Medical History  Diagnosis Date  . Arthritis   . Atrial fibrillation   . Diastolic heart failure, NYHA class 2     Noted on echo  . Rectal prolapse   . Hypertension   . Esophageal dysphagia      with a tortuous esophagus  . Pyloric stricture February 2014    Status post dilation in February 2014    Prior Cardiac Evaluation and Past Surgical History: Past Surgical History  Procedure Laterality Date  . Total hip arthroplasty    . Arthroscopic repair pcl      x2  . Hernia repair with mesh  2011  . Esophagogastroduodenoscopy  05/22/2012    Procedure: ESOPHAGOGASTRODUODENOSCOPY (EGD);  Surgeon: Theda Belfast, MD;  Location: [facility];  Service: Endoscopy;  Laterality: N/A;   Rm 1512      No Known Allergies  Current Outpatient Prescriptions  Medication Sig Dispense Refill  . carvedilol (COREG) 6.25 MG tablet TAKE 1 TABLET BY MOUTH TWICE A DAY  60 tablet  6  . Multiple Vitamin (MULTIVITAMIN) tablet Take 1 tablet by mouth daily.        Marland Kitchen warfarin (COUMADIN) 2.5 MG tablet Take 2.5 mg by mouth daily.       No current facility-administered medications for this visit.    History   Social History  . Marital Status: Widowed    Spouse Name: N/A    Number of Children: N/A  . Years of Education: N/A   Occupational History  . retired    Social History Main Topics  . Smoking status: Never Smoker   . Smokeless tobacco: Never Used  . Alcohol Use: No  . Drug Use: No  . Sexually Active: No   Other Topics Concern  . Not on file   Social History Narrative   Widowed mother of one daughter. The daughter is very active in her care. She is not very active but is okay with this. Never smoked and does not drink.    ROS: A comprehensive Review of Systems - Negative except Symptoms noted above.  PHYSICAL EXAM BP 140/80  Pulse 71  Ht 5' (1.524 m)  Wt 95 lb 9.6 oz (43.364 kg)  BMI 18.67 kg/m2 General appearance: alert,  cooperative, appears stated age, no distress and Thin and frail, but well-groomed. Quite hard of hearing. In a not sure how much she understands but we say. Her daughter is aching most information. She Neck: no adenopathy, no carotid bruit, no JVD, supple, symmetrical, trachea midline and thyroid not enlarged, symmetric, no tenderness/mass/nodules Lungs: clear to auscultation bilaterally, normal percussion bilaterally and Normal effort, good air movement Heart: irregularly irregular rhythm, S1, S2 normal, no S3 or S4 and No M./R./G.; nondisplaced PMI Abdomen: soft, non-tender; bowel sounds normal; no masses,  no organomegaly Extremities: extremities normal, atraumatic, no cyanosis or edema, no edema, redness or tenderness in the calves or thighs  and no ulcers, gangrene or trophic changes Pulses: 2+ and symmetric Neurologic: Grossly normal; quite hard of hearing. HEENT: [place]/AT, EOMI, MMM, anicteric sclera, mild arcus senilis  [id number] today: Yes Rate: 71 , Rhythm:  Atrial fibrillation, left axis deviation, otherwise normal ECG. No significant change.  She is post a CBC and CMP at that time her last visit but did not do so.  ASSESSMENT / PLAN:: In stable, but cardiac standpoint. Relatively a symptomatic from her atrial fibrillation. No recent falls. Comfortable with current warfarin therapy.  I've instructed the patient to monitor for certain symptoms as follows:  Bleeding -- blood in stool, dark tarry stool, nosebleeds or blood in the urine  Shortness of breath with exertion or at rest or when lying down flat  Chest discomfort with rest or with exertion  Sensation of your heart going very very fast   More than usual lightheadedness, dizziness or wooziness or feel like he may pass out   ATRIAL FIBRILLATION WITH CONTROLLED VENTRICULAR RESPONSE Rate is relatively well controlled with carvedilol. She is not symptomatic. Nonionic find listed dose up any just to avoid any orthostatic symptoms. She remained in dilated on warfarin. I think they've decided to stick with warfarin. She'll continue discussed with Phillips Hay, Pharm.D. to see if they have any time she wants to switch over to a NOAC.  Chronic combined systolic and diastolic CHF (congestive heart failure) At [facility] at half her echocardiogram on the spacing this on Dr. Tenny Craw' clinic notes. She's not having any heart failure symptoms of any PND orthopnea or edema. No need for any diuretic agent. She simply doesn't tolerate rapid A. fib.  Moderate to severe pulmonary hypertension Again I don't have the benefit of the echocardiogram that was done in the past. But she is not having significant symptoms to suggest this is a major concern. It may be in part due to her  kyphosis.  Essential hypertension -- allowing for mild permissive hypertension Stable with current medications. No active orthostatic symptoms.   Followup: 6 months  Karess Harner W, M.D., M.S. [facility] [address] [place], [place]  [address]  [phone] Pager # [phone]

## 2012-11-24 NOTE — Assessment & Plan Note (Signed)
Again I don't have the benefit of the echocardiogram that was done in the past. But she is not having significant symptoms to suggest this is a major concern. It may be in part due to her kyphosis.

## 2012-12-01 ENCOUNTER — Encounter: Payer: Self-pay | Admitting: Family

## 2012-12-12 ENCOUNTER — Ambulatory Visit (INDEPENDENT_AMBULATORY_CARE_PROVIDER_SITE_OTHER): Payer: Medicare Other | Admitting: Pharmacist Clinician (PhC)/ Clinical Pharmacy Specialist

## 2012-12-12 VITALS — BP 140/88 | HR 64

## 2012-12-12 DIAGNOSIS — I4891 Unspecified atrial fibrillation: Secondary | ICD-10-CM

## 2012-12-12 DIAGNOSIS — Z7901 Long term (current) use of anticoagulants: Secondary | ICD-10-CM

## 2013-01-21 ENCOUNTER — Other Ambulatory Visit: Payer: Self-pay | Admitting: *Deleted

## 2013-01-21 MED ORDER — WARFARIN SODIUM 2.5 MG PO TABS: ORAL_TABLET | ORAL | Status: DC

## 2013-01-21 NOTE — Telephone Encounter (Signed)
Rx was sent to pharmacy electronically. 

## 2013-01-23 ENCOUNTER — Ambulatory Visit (INDEPENDENT_AMBULATORY_CARE_PROVIDER_SITE_OTHER): Payer: Medicare Other | Admitting: Pharmacist Clinician (PhC)/ Clinical Pharmacy Specialist

## 2013-01-23 VITALS — BP 160/90 | HR 72

## 2013-01-23 DIAGNOSIS — I4891 Unspecified atrial fibrillation: Secondary | ICD-10-CM

## 2013-01-23 DIAGNOSIS — Z7901 Long term (current) use of anticoagulants: Secondary | ICD-10-CM

## 2013-01-23 LAB — POCT INR: INR: 2.4

## 2013-03-06 ENCOUNTER — Ambulatory Visit (INDEPENDENT_AMBULATORY_CARE_PROVIDER_SITE_OTHER): Payer: Medicare Other | Admitting: Pharmacist Clinician (PhC)/ Clinical Pharmacy Specialist

## 2013-03-06 VITALS — BP 140/92 | HR 80

## 2013-03-06 DIAGNOSIS — I4891 Unspecified atrial fibrillation: Secondary | ICD-10-CM

## 2013-03-06 DIAGNOSIS — Z7901 Long term (current) use of anticoagulants: Secondary | ICD-10-CM

## 2013-03-06 LAB — POCT INR: INR: 2.3

## 2013-04-15 ENCOUNTER — Ambulatory Visit (INDEPENDENT_AMBULATORY_CARE_PROVIDER_SITE_OTHER): Payer: Medicare Other | Admitting: Pharmacist Clinician (PhC)/ Clinical Pharmacy Specialist

## 2013-04-15 VITALS — BP 136/72 | HR 76

## 2013-04-15 DIAGNOSIS — Z7901 Long term (current) use of anticoagulants: Secondary | ICD-10-CM

## 2013-04-15 DIAGNOSIS — I4891 Unspecified atrial fibrillation: Secondary | ICD-10-CM

## 2013-04-15 LAB — POCT INR: INR: 2.5

## 2013-05-23 ENCOUNTER — Ambulatory Visit (INDEPENDENT_AMBULATORY_CARE_PROVIDER_SITE_OTHER): Payer: Medicare Other | Admitting: Pharmacist Clinician (PhC)/ Clinical Pharmacy Specialist

## 2013-05-23 ENCOUNTER — Encounter: Payer: Self-pay | Admitting: Cardiology

## 2013-05-23 ENCOUNTER — Ambulatory Visit (INDEPENDENT_AMBULATORY_CARE_PROVIDER_SITE_OTHER): Payer: Medicare Other | Admitting: Cardiology

## 2013-05-23 VITALS — BP 154/108 | HR 73 | Ht 60.0 in | Wt 100.6 lb

## 2013-05-23 DIAGNOSIS — I4891 Unspecified atrial fibrillation: Secondary | ICD-10-CM

## 2013-05-23 DIAGNOSIS — I10 Essential (primary) hypertension: Secondary | ICD-10-CM

## 2013-05-23 DIAGNOSIS — I340 Nonrheumatic mitral (valve) insufficiency: Secondary | ICD-10-CM

## 2013-05-23 DIAGNOSIS — I5042 Chronic combined systolic (congestive) and diastolic (congestive) heart failure: Secondary | ICD-10-CM

## 2013-05-23 DIAGNOSIS — I059 Rheumatic mitral valve disease, unspecified: Secondary | ICD-10-CM

## 2013-05-23 DIAGNOSIS — Z7901 Long term (current) use of anticoagulants: Secondary | ICD-10-CM

## 2013-05-23 DIAGNOSIS — I509 Heart failure, unspecified: Secondary | ICD-10-CM

## 2013-05-23 LAB — POCT INR: INR: 2

## 2013-05-23 NOTE — Patient Instructions (Signed)
Continue with current medication.   Check blood pressure   Your physician wants you to follow-up in 6 months Dr Herbie BaltimoreHarding.  You will receive a reminder letter in the mail two months in advance. If you don't receive a letter, please call our office to schedule the follow-up appointment.

## 2013-05-25 NOTE — Progress Notes (Signed)
Patient ID: Lindsey Ferguson, female   DOB: October 26, 1919, 80 y.o.   MRN: [id number]  Clinic Note: HPI: Lindsey Ferguson is a 80 y.o. female with a PMH below who presents today for atrial fibrillation and mild chronic diastolic dysfunction in the setting of mild hypertension. I saw her back in July of this year.  Interval History:  She sees her doing quite well overall. She still has her baseline dyspnea on exertion, but as her daughter notes, she really doesn't have that much the way of any exertion. She's actually fine with amount of activity she doesn't is mostly walking with a walker or uses a wheelchair. She does have unsteady gait with some balance issues. She says on occasion she'll wake up at night because she is not sleeping well but denies any PND, Orthopnea or edema.  No chest discomfort or pressure with rest or exertion. No resting dyspnea and only minimal exertional dyspnea as described. She denies any symptoms of her atrial fibrillation no palpitations symptoms no rapid heart rates or syncope/near syncope. No lightheadedness or dizziness. No TIA or RCA symptoms. No melena, hematochezia or hematuria.  Most of her activities limited due to her knee and back arthritis. She also has significant kyphosis. No recent falls however.  Past Medical History  Diagnosis Date  . Arthritis   . Atrial fibrillation   . Diastolic heart failure, NYHA class 2     Noted on echo  . Rectal prolapse   . Hypertension   . Esophageal dysphagia      with a tortuous esophagus  . Pyloric stricture February 2014    Status post dilation in February 2014    No Known Allergies  Current Outpatient Prescriptions  Medication Sig Dispense Refill  . carvedilol (COREG) 6.25 MG tablet TAKE 1 TABLET BY MOUTH TWICE A DAY  60 tablet  6  . Multiple Vitamin (MULTIVITAMIN) tablet Take 1 tablet by mouth daily.        Marland Kitchen warfarin (COUMADIN) 2.5 MG tablet Take 1 to 1 & 1/2 tablets daily, as directed per INR  120 tablet  1    No current facility-administered medications for this visit.   History   Social History Narrative   Widowed mother of one daughter. The daughter is very active in her care. She is not very active but is okay with this. Never smoked and does not drink.    ROS: A comprehensive Review of Systems - Negative except Symptoms noted above.  PHYSICAL EXAM BP 154/108  Pulse 73  Ht 5' (1.524 m)  Wt 100 lb 9.6 oz (45.632 kg)  BMI 19.65 kg/m2 General appearance: alert and oriented x3, cooperative, appears stated age, no distress and Thin and frail, but well-groomed. Quite hard of hearing. In a not sure how much she understands but we say. Her daughter is aching most information. She HEENT: [place]/AT, EOMI, MMM, anicteric sclera, mild arcus senilis Neck: no adenopathy, no carotid bruit, no JVD, supple, symmetrical, trachea midline and thyroid not enlarged, symmetric, no tenderness/mass/nodules Lungs: CTA B., normal percussion bilaterally and Normal effort, good air movement Heart: irregularly irregular rhythm, S1, S2 normal, no S3 or S4 and No M./R./G.; nondisplaced PMI Abdomen: soft, non-tender; bowel sounds normal; no masses,  no organomegaly Extremities: no cyanosis or edema, no edema, Pulses: 2+ and symmetric Neurologic: Grossly normal;   [id number] today: Yes Rate: 73 , Rhythm:  Atrial fibrillation, borderline rightward axis deviation, otherwise normal ECG. This would suggest lead placement difference in otherwise  and no change  ASSESSMENT / PLAN:: Stable from a cardiac standpoint. Relatively a symptomatic from her atrial fibrillation. No recent falls. Comfortable with current warfarin therapy.  I've instructed the patient to monitor for certain symptoms as follows:  Bleeding -- blood in stool, dark tarry stool, nosebleeds or blood in the urine  Shortness of breath with exertion or at rest or when lying down flat  Chest discomfort with rest or with exertion  Sensation of your heart  going very very fast   More than usual lightheadedness, dizziness or wooziness or feel like he may pass out   ATRIAL FIBRILLATION WITH CONTROLLED VENTRICULAR RESPONSE Rate controlled and relatively asymptomatic on beta blocker. Anticoagulated on warfarin.  Chronic combined systolic and diastolic CHF (congestive heart failure) Unfortunately again I do not have the results of her echocardiogram to corroborate this diagnosis. What is known however is that she does not tolerate rapid A. Fib. If she were to have any worsening symptoms, and would consider ordering an echocardiogram to get a new baseline as well as to evaluate the documented mitral regurgitation and moderate to severe pulmonary hypertension but I don't have a study to be substantiated.  Essential hypertension -- allowing for mild permissive hypertension Pressures are little bit high today, most notably the diastolic. His asymptomatic with no headaches or blurred vision. Am reluctant to make any significant changes, because of a permissive hypertension is acceptable to avoid potential falls. If it remains elevated we could consider adding an ACE inhibitor I asked that she have someone check her blood pressure every so often in order to determine if this is just a one-time finding of this high of a blood pressure.  Mitral regurgitation No significant murmur heard on exam to suggest significant MR   Followup: 6 months  Lindsey Ferguson W, M.D., M.S. [facility] [address] [place], [place]  [id number]  [phone] Pager # [phone]

## 2013-05-25 NOTE — Assessment & Plan Note (Addendum)
Unfortunately again I do not have the results of her echocardiogram to corroborate this diagnosis. What is known however is that she does not tolerate rapid A. Fib. If she were to have any worsening symptoms, and would consider ordering an echocardiogram to get a new baseline as well as to evaluate the documented mitral regurgitation and moderate to severe pulmonary hypertension but I don't have a study to be substantiated.

## 2013-05-25 NOTE — Assessment & Plan Note (Addendum)
Pressures are little bit high today, most notably the diastolic. His asymptomatic with no headaches or blurred vision. Am reluctant to make any significant changes, because of a permissive hypertension is acceptable to avoid potential falls. If it remains elevated we could consider adding an ACE inhibitor I asked that she have someone check her blood pressure every so often in order to determine if this is just a one-time finding of this high of a blood pressure.

## 2013-05-25 NOTE — Assessment & Plan Note (Signed)
Rate controlled and relatively asymptomatic on beta blocker. Anticoagulated on warfarin.

## 2013-05-25 NOTE — Assessment & Plan Note (Signed)
No significant murmur heard on exam to suggest significant MR

## 2013-05-26 ENCOUNTER — Telehealth: Payer: Self-pay | Admitting: Family

## 2013-05-26 NOTE — Telephone Encounter (Signed)
Please contact pt and check with her if she is following with another PCP. I think she might have PCP at her facility.  If not, she should arrange follow up with us.  Just want to make sure someone is following ehr.

## 2013-05-26 NOTE — Telephone Encounter (Signed)
Letter mailed to pt for verification.

## 2013-07-03 ENCOUNTER — Ambulatory Visit: Payer: Medicare Other | Admitting: Pharmacist Clinician (PhC)/ Clinical Pharmacy Specialist

## 2013-07-10 ENCOUNTER — Ambulatory Visit: Payer: Medicare Other | Admitting: Pharmacist Clinician (PhC)/ Clinical Pharmacy Specialist

## 2013-07-17 ENCOUNTER — Ambulatory Visit: Payer: Medicare Other | Admitting: Pharmacist Clinician (PhC)/ Clinical Pharmacy Specialist

## 2013-07-23 ENCOUNTER — Ambulatory Visit (INDEPENDENT_AMBULATORY_CARE_PROVIDER_SITE_OTHER): Payer: Medicare Other | Admitting: Pharmacist Clinician (PhC)/ Clinical Pharmacy Specialist

## 2013-07-23 VITALS — BP 150/100 | HR 68

## 2013-07-23 DIAGNOSIS — Z7901 Long term (current) use of anticoagulants: Secondary | ICD-10-CM

## 2013-07-23 DIAGNOSIS — I4891 Unspecified atrial fibrillation: Secondary | ICD-10-CM

## 2013-07-23 LAB — POCT INR: INR: 2.2

## 2013-07-29 ENCOUNTER — Other Ambulatory Visit: Payer: Self-pay | Admitting: Cardiology

## 2013-08-25 ENCOUNTER — Other Ambulatory Visit: Payer: Self-pay | Admitting: *Deleted

## 2013-08-25 MED ORDER — CARVEDILOL 6.25 MG PO TABS: 6.2500 mg | ORAL_TABLET | Freq: Two times a day (BID) | ORAL | Status: DC

## 2013-08-25 NOTE — Telephone Encounter (Signed)
Rx refill sent to patients pharmacy  

## 2013-09-05 ENCOUNTER — Ambulatory Visit: Payer: Medicare Other | Admitting: Pharmacist Clinician (PhC)/ Clinical Pharmacy Specialist

## 2013-09-09 ENCOUNTER — Ambulatory Visit (INDEPENDENT_AMBULATORY_CARE_PROVIDER_SITE_OTHER): Payer: Medicare Other | Admitting: Pharmacist Clinician (PhC)/ Clinical Pharmacy Specialist

## 2013-09-09 VITALS — BP 130/88 | HR 68

## 2013-09-09 DIAGNOSIS — Z7901 Long term (current) use of anticoagulants: Secondary | ICD-10-CM

## 2013-09-09 DIAGNOSIS — I4891 Unspecified atrial fibrillation: Secondary | ICD-10-CM

## 2013-09-09 LAB — POCT INR: INR: 2.9

## 2013-10-20 ENCOUNTER — Ambulatory Visit (INDEPENDENT_AMBULATORY_CARE_PROVIDER_SITE_OTHER): Payer: Medicare Other | Admitting: Pharmacist Clinician (PhC)/ Clinical Pharmacy Specialist

## 2013-10-20 DIAGNOSIS — Z7901 Long term (current) use of anticoagulants: Secondary | ICD-10-CM

## 2013-10-20 DIAGNOSIS — I4891 Unspecified atrial fibrillation: Secondary | ICD-10-CM

## 2013-10-20 LAB — POCT INR: INR: 2

## 2013-11-01 ENCOUNTER — Telehealth: Payer: Self-pay | Admitting: Pharmacist Clinician (PhC)/ Clinical Pharmacy Specialist

## 2013-11-03 NOTE — Telephone Encounter (Signed)
Closed enocunter °

## 2013-12-01 ENCOUNTER — Ambulatory Visit (INDEPENDENT_AMBULATORY_CARE_PROVIDER_SITE_OTHER): Payer: Medicare Other | Admitting: Pharmacist

## 2013-12-01 DIAGNOSIS — Z7901 Long term (current) use of anticoagulants: Secondary | ICD-10-CM

## 2013-12-01 DIAGNOSIS — I4891 Unspecified atrial fibrillation: Secondary | ICD-10-CM

## 2013-12-01 LAB — POCT INR: INR: 2.2

## 2014-01-12 ENCOUNTER — Ambulatory Visit (INDEPENDENT_AMBULATORY_CARE_PROVIDER_SITE_OTHER): Payer: Medicare Other | Admitting: Pharmacist Clinician (PhC)/ Clinical Pharmacy Specialist

## 2014-01-12 VITALS — BP 150/88

## 2014-01-12 DIAGNOSIS — Z7901 Long term (current) use of anticoagulants: Secondary | ICD-10-CM

## 2014-01-12 DIAGNOSIS — I4891 Unspecified atrial fibrillation: Secondary | ICD-10-CM

## 2014-01-12 LAB — POCT INR: INR: 2

## 2014-02-23 ENCOUNTER — Ambulatory Visit (INDEPENDENT_AMBULATORY_CARE_PROVIDER_SITE_OTHER): Payer: Medicare Other | Admitting: Pharmacist Clinician (PhC)/ Clinical Pharmacy Specialist

## 2014-02-23 DIAGNOSIS — I4891 Unspecified atrial fibrillation: Secondary | ICD-10-CM

## 2014-02-23 DIAGNOSIS — Z7901 Long term (current) use of anticoagulants: Secondary | ICD-10-CM

## 2014-02-23 LAB — POCT INR: INR: 1.5

## 2014-02-23 MED ORDER — WARFARIN SODIUM 2.5 MG PO TABS: ORAL_TABLET | ORAL | Status: DC

## 2014-02-26 ENCOUNTER — Encounter: Payer: Self-pay | Admitting: Gastroenterology

## 2014-03-23 ENCOUNTER — Ambulatory Visit (INDEPENDENT_AMBULATORY_CARE_PROVIDER_SITE_OTHER): Payer: Medicare Other | Admitting: Pharmacist Clinician (PhC)/ Clinical Pharmacy Specialist

## 2014-03-23 DIAGNOSIS — Z7901 Long term (current) use of anticoagulants: Secondary | ICD-10-CM

## 2014-03-23 DIAGNOSIS — I4891 Unspecified atrial fibrillation: Secondary | ICD-10-CM

## 2014-03-23 LAB — POCT INR: INR: 1.8

## 2014-04-20 ENCOUNTER — Ambulatory Visit (INDEPENDENT_AMBULATORY_CARE_PROVIDER_SITE_OTHER): Payer: Medicare Other | Admitting: Pharmacist Clinician (PhC)/ Clinical Pharmacy Specialist

## 2014-04-20 DIAGNOSIS — I4891 Unspecified atrial fibrillation: Secondary | ICD-10-CM

## 2014-04-20 DIAGNOSIS — Z7901 Long term (current) use of anticoagulants: Secondary | ICD-10-CM

## 2014-04-20 LAB — POCT INR: INR: 2.3

## 2014-05-24 ENCOUNTER — Other Ambulatory Visit: Payer: Self-pay | Admitting: Pharmacist Clinician (PhC)/ Clinical Pharmacy Specialist

## 2014-06-01 ENCOUNTER — Ambulatory Visit (INDEPENDENT_AMBULATORY_CARE_PROVIDER_SITE_OTHER): Payer: Medicare Other | Admitting: Pharmacist Clinician (PhC)/ Clinical Pharmacy Specialist

## 2014-06-01 DIAGNOSIS — I4891 Unspecified atrial fibrillation: Secondary | ICD-10-CM

## 2014-06-01 DIAGNOSIS — Z7901 Long term (current) use of anticoagulants: Secondary | ICD-10-CM

## 2014-06-01 LAB — POCT INR: INR: 2.5

## 2014-07-13 ENCOUNTER — Ambulatory Visit (INDEPENDENT_AMBULATORY_CARE_PROVIDER_SITE_OTHER): Payer: Medicare Other | Admitting: Pharmacist Clinician (PhC)/ Clinical Pharmacy Specialist

## 2014-07-13 DIAGNOSIS — Z7901 Long term (current) use of anticoagulants: Secondary | ICD-10-CM

## 2014-07-13 DIAGNOSIS — I4891 Unspecified atrial fibrillation: Secondary | ICD-10-CM

## 2014-07-13 LAB — POCT INR: INR: 2.6

## 2014-08-24 ENCOUNTER — Ambulatory Visit (INDEPENDENT_AMBULATORY_CARE_PROVIDER_SITE_OTHER): Payer: Medicare Other | Admitting: Pharmacist Clinician (PhC)/ Clinical Pharmacy Specialist

## 2014-08-24 DIAGNOSIS — I4891 Unspecified atrial fibrillation: Secondary | ICD-10-CM

## 2014-08-24 DIAGNOSIS — Z7901 Long term (current) use of anticoagulants: Secondary | ICD-10-CM | POA: Diagnosis not present

## 2014-08-24 LAB — POCT INR: INR: 2.9

## 2014-10-05 ENCOUNTER — Ambulatory Visit (INDEPENDENT_AMBULATORY_CARE_PROVIDER_SITE_OTHER): Payer: Medicare Other | Admitting: Pharmacist Clinician (PhC)/ Clinical Pharmacy Specialist

## 2014-10-05 ENCOUNTER — Other Ambulatory Visit: Payer: Self-pay | Admitting: Cardiology

## 2014-10-05 DIAGNOSIS — Z7901 Long term (current) use of anticoagulants: Secondary | ICD-10-CM | POA: Diagnosis not present

## 2014-10-05 DIAGNOSIS — I4891 Unspecified atrial fibrillation: Secondary | ICD-10-CM

## 2014-10-05 LAB — POCT INR: INR: 3.1

## 2014-10-06 ENCOUNTER — Telehealth: Payer: Self-pay | Admitting: Cardiology

## 2014-10-07 NOTE — Telephone Encounter (Signed)
Closed encounter °

## 2014-11-11 ENCOUNTER — Other Ambulatory Visit: Payer: Self-pay | Admitting: Cardiology

## 2014-11-11 NOTE — Telephone Encounter (Signed)
Rx(s) sent to pharmacy electronically.  

## 2014-11-17 ENCOUNTER — Ambulatory Visit (INDEPENDENT_AMBULATORY_CARE_PROVIDER_SITE_OTHER): Payer: Medicare Other | Admitting: Pharmacist Clinician (PhC)/ Clinical Pharmacy Specialist

## 2014-11-17 DIAGNOSIS — Z7901 Long term (current) use of anticoagulants: Secondary | ICD-10-CM

## 2014-11-17 DIAGNOSIS — I4891 Unspecified atrial fibrillation: Secondary | ICD-10-CM | POA: Diagnosis not present

## 2014-11-17 LAB — POCT INR: INR: 2.4

## 2014-12-07 ENCOUNTER — Other Ambulatory Visit: Payer: Self-pay | Admitting: Cardiology

## 2014-12-18 ENCOUNTER — Ambulatory Visit (INDEPENDENT_AMBULATORY_CARE_PROVIDER_SITE_OTHER): Payer: Medicare Other | Admitting: Cardiology

## 2014-12-18 ENCOUNTER — Ambulatory Visit (INDEPENDENT_AMBULATORY_CARE_PROVIDER_SITE_OTHER): Payer: Medicare Other | Admitting: Pharmacist Clinician (PhC)/ Clinical Pharmacy Specialist

## 2014-12-18 ENCOUNTER — Encounter: Payer: Self-pay | Admitting: Cardiology

## 2014-12-18 VITALS — BP 124/78 | HR 75 | Ht 60.5 in | Wt 82.4 lb

## 2014-12-18 DIAGNOSIS — I272 Pulmonary hypertension, unspecified: Secondary | ICD-10-CM

## 2014-12-18 DIAGNOSIS — Z7901 Long term (current) use of anticoagulants: Secondary | ICD-10-CM

## 2014-12-18 DIAGNOSIS — I10 Essential (primary) hypertension: Secondary | ICD-10-CM

## 2014-12-18 DIAGNOSIS — I4819 Other persistent atrial fibrillation: Secondary | ICD-10-CM

## 2014-12-18 DIAGNOSIS — I5042 Chronic combined systolic (congestive) and diastolic (congestive) heart failure: Secondary | ICD-10-CM | POA: Diagnosis not present

## 2014-12-18 DIAGNOSIS — I340 Nonrheumatic mitral (valve) insufficiency: Secondary | ICD-10-CM

## 2014-12-18 DIAGNOSIS — I270 Primary pulmonary hypertension: Secondary | ICD-10-CM

## 2014-12-18 DIAGNOSIS — I4891 Unspecified atrial fibrillation: Secondary | ICD-10-CM

## 2014-12-18 DIAGNOSIS — I481 Persistent atrial fibrillation: Secondary | ICD-10-CM

## 2014-12-18 LAB — POCT INR: INR: 2.9

## 2014-12-18 NOTE — Patient Instructions (Addendum)
  NO CHANGE WITH CURRENT MEDICATION   Your physician wants you to follow-up in  12    [facility] DR HARDING - 30 MIN APPOINTMENT.- WITH COUMADIN  You will receive a reminder letter in the mail two months in advance. If you don't receive a letter, please call our office to schedule the follow-up appointment.

## 2014-12-18 NOTE — Progress Notes (Signed)
PCP: Lemont Fillers., NP  Clinic Note: Chief Complaint  Patient presents with  . Follow-up      no chest pain, no shortness of breath, no edema, no pain in legs, no cramping in legs, no lightheadedness, no dizziness  . Atrial Fibrillation    HPI: Lindsey Ferguson is a 81 y.o. female with a PMH below who presents today for 18 month followup for her persistent, asymptomatic atrial fibrillation. She had previously been following up with Dr. Tenny Craw, but asked to see a different cardiologist back in 2014 for personality issues with the warfarin clinic nurse at the [facility]  Lindsey Ferguson was last seen in Jan 2015, but routinely sees Phillips Hay, RPH-CPP for INR evaluation.  Recent Hospitalizations: none  Studies Reviewed: n/a  Interval History: She presents today, stating that she really does not know why she even has to be here she denies any sensation whatsoever of being in an irregular heart rhythm. No sense of palpitations, rapid heart beat. No syncope or near syncope. No TIA or amaurosis fugax. Thankfully no bleeding complications while on warfarin: No melena, hematochezia, hematuria or nosebleeds. She has never had any complaints whatsoever her chest tightness or pressure with rest or exertion. She does have some mild baseline exertional dyspnea, mostly because it takes a lot of effort for her to even stand up and walk with her walker. She is reported moderate to severe pulmonary hypertension but her symptoms would not indicate that this is only symptomatic. Probably more related to lung restriction with her kyphosis and scoliosis.  She has balance issues and therefore has to use a walker. Mostly when she does walk around the house otherwise nothing notable.  Past Medical History  Diagnosis Date  . Arthritis   . Atrial fibrillation   . Diastolic heart failure, NYHA class 2     Noted on echo  . Rectal prolapse   . Hypertension   . Esophageal dysphagia      with a  tortuous esophagus  . Pyloric stricture February 2014    Status post dilation in February 2014    Past Surgical History  Procedure Laterality Date  . Total hip arthroplasty    . Arthroscopic repair pcl      x2  . Hernia repair with mesh  2011  . Esophagogastroduodenoscopy  05/22/2012    Procedure: ESOPHAGOGASTRODUODENOSCOPY (EGD);  Surgeon: Theda Belfast, MD;  Location: [facility];  Service: Endoscopy;  Laterality: N/A;  Rm 1512    ROS: A comprehensive was performed. Review of Systems  Constitutional: Positive for weight loss (18 pound weight loss -- simply not eating much). Negative for malaise/fatigue.  HENT: Negative for nosebleeds.   Respiratory: Negative for shortness of breath.   Cardiovascular: Negative for claudication and leg swelling.  Gastrointestinal: Negative for blood in stool and melena.  Genitourinary: Negative for hematuria.  Musculoskeletal: Positive for back pain (Very difficult to even stand up), joint pain and falls (none recently).  Neurological: Positive for dizziness (Occasionally when she first stands up, therefore she has to hold on to her walker.). Negative for headaches.  Psychiatric/Behavioral: Negative for depression.    Current Outpatient Prescriptions on File Prior to Visit  Medication Sig Dispense Refill  . carvedilol (COREG) 6.25 MG tablet Take 1 tablet (6.25 mg total) by mouth 2 (two) times daily with a meal. NEEDS APPOINTMENT FOR FUTURE REFILLS 60 tablet 0  . Multiple Vitamin (MULTIVITAMIN) tablet Take 1 tablet by mouth daily.      Marland Kitchen  warfarin (COUMADIN) 2.5 MG tablet TAKE 1 TO 1 & 1/2 TABLETS DAILY, AS DIRECTED PER INR 120 tablet 1   No current facility-administered medications on file prior to visit.   No Known Allergies   History   Social History  . Marital Status: Widowed    Spouse Name: N/A  . Number of Children: N/A  . Years of Education: N/A   Occupational History  . retired    Social History Main Topics  . Smoking status:  Never Smoker   . Smokeless tobacco: Never Used  . Alcohol Use: No  . Drug Use: No  . Sexual Activity: No   Other Topics Concern  . Not on file   Social History Narrative   Widowed mother of one daughter. The daughter is very active in her care. She is not very active but is okay with this. Never smoked and does not drink.   Family History  Problem Relation Age of Onset  . Stroke Father   . Brain cancer Mother   . Cancer Brother     Wt Readings from Last 3 Encounters:  12/18/14 37.365 kg (82 lb 6 oz)  05/23/13 45.632 kg (100 lb 9.6 oz)  11/13/12 43.364 kg (95 lb 9.6 oz)    PHYSICAL EXAM BP 124/78 mmHg  Pulse 75  Ht 5' 0.5" (1.537 m)  Wt 37.365 kg (82 lb 6 oz)  BMI 15.82 kg/m2 General appearance: alert, cooperative, appears stated age, no distress; frustrated Neck: no adenopathy, no carotid bruit and no JVD Lungs:CTAB, normal percussion bilaterally and non-labored Heart:  irregularly irregular rhythm, S1, S2 normal, no S3 or S4 and No M./R./G.; nondisplaced PMI Abdomen: soft, non-tender; bowel sounds normal; no masses,  no organomegaly; Extremities: extremities normal, atraumatic, no cyanosis, and edema  Pulses: 2+ and symmetric; Neurologic: Mental status: Alert, oriented, thought content appropriate MSK: significant thoracic kyphosis and scoliosis    Adult ECG Report  Rate: 75 ;  Rhythm: atrial fibrillation and Poor R wave progression in precordial leads suggesting possible septal infarct, age undetermined. CRO Lateral infarct, age undetermined  Narrative Interpretation: stable   Other studies Reviewed: Additional studies/ records that were reviewed today include:  Recent Labs:  No labs available besides INR Lab Results  Component Value Date   INR 2.9 12/18/2014   INR 2.4 11/17/2014   INR 3.1 10/05/2014    ASSESSMENT / PLAN: Problem List Items Addressed This Visit    ATRIAL FIBRILLATION WITH CONTROLLED VENTRICULAR RESPONSE (Chronic)    Rate control with  low-dose carvedilol. No symptoms. Anticoagulant with warfarin      Relevant Orders   EKG 12-Lead   Chronic combined systolic and diastolic CHF (congestive heart failure) (Chronic)    Well compensated with no active symptoms. Echo results are not available. In the absence of PND, orthopnea or significant edema we elected to place her on a diuretic. She is on carvedilol which is appropriate for both atrium and CHF      Relevant Orders   EKG 12-Lead   Essential hypertension -- allowing for mild permissive hypertension - Primary (Chronic)    Well-controlled pressures today. Again per previous notes, allowing for permissive hypertension. We are not adding an ACE inhibitor in order to keep things simple and not over treat. Do not want to increase potential for orthostatic dizziness.      Relevant Orders   EKG 12-Lead   Long term current use of anticoagulant therapy   Mitral regurgitation (Chronic)   Relevant Orders  EKG 12-Lead   Moderate to severe pulmonary hypertension (Chronic)    Symptoms do not correlate with this finding -- currently no adequate symptom reported to require further investigation or even treatment. Would simply recommend maintaining systolic pressures at a stable level. Potentially this finding is related to restrictive lung disease from kyphosis.        Overall, Lindsey Ferguson is very stable from cardiac standpoint. She is not having any symptoms and he questions the need for her to continue to see a cardiologist when she is here for routine indicate her blood work checked. She really seems to be more interested in being left alone.  I think for now on we can followup with her by checking in and only during one of her warfarin clinic visits with Belenda Cruise. I don't think that she needs to continue to see me unless symptoms occur. This is based on her wishes, and the daughter agrees.   Current medicines are reviewed at length with the patient today. (+/- concerns) n/a The  following changes have been made: n/a    Marykay Lex, M.D., M.S. Interventional Cardiologist   Pager # [phone]

## 2014-12-20 ENCOUNTER — Encounter: Payer: Self-pay | Admitting: Cardiology

## 2014-12-20 NOTE — Assessment & Plan Note (Signed)
Well compensated with no active symptoms. Echo results are not available. In the absence of PND, orthopnea or significant edema we elected to place her on a diuretic. She is on carvedilol which is appropriate for both atrium and CHF

## 2014-12-20 NOTE — Assessment & Plan Note (Signed)
Rate control with low-dose carvedilol. No symptoms. Anticoagulant with warfarin

## 2014-12-20 NOTE — Assessment & Plan Note (Addendum)
Symptoms do not correlate with this finding -- currently no adequate symptom reported to require further investigation or even treatment. Would simply recommend maintaining systolic pressures at a stable level. Potentially this finding is related to restrictive lung disease from kyphosis.

## 2014-12-20 NOTE — Assessment & Plan Note (Signed)
Well-controlled pressures today. Again per previous notes, allowing for permissive hypertension. We are not adding an ACE inhibitor in order to keep things simple and not over treat. Do not want to increase potential for orthostatic dizziness.

## 2014-12-26 ENCOUNTER — Other Ambulatory Visit: Payer: Self-pay | Admitting: Cardiology

## 2014-12-28 NOTE — Telephone Encounter (Signed)
Rx(s) sent to pharmacy electronically.  

## 2015-01-29 ENCOUNTER — Ambulatory Visit: Payer: Medicare Other | Admitting: Pharmacist Clinician (PhC)/ Clinical Pharmacy Specialist

## 2015-02-01 ENCOUNTER — Ambulatory Visit: Payer: Medicare Other | Admitting: Pharmacist Clinician (PhC)/ Clinical Pharmacy Specialist

## 2015-02-04 ENCOUNTER — Ambulatory Visit (INDEPENDENT_AMBULATORY_CARE_PROVIDER_SITE_OTHER): Payer: Medicare Other | Admitting: Pharmacist Clinician (PhC)/ Clinical Pharmacy Specialist

## 2015-02-04 DIAGNOSIS — I4891 Unspecified atrial fibrillation: Secondary | ICD-10-CM

## 2015-02-04 DIAGNOSIS — Z7901 Long term (current) use of anticoagulants: Secondary | ICD-10-CM | POA: Diagnosis not present

## 2015-02-04 LAB — POCT INR: INR: 4.2

## 2015-02-04 MED ORDER — APIXABAN 2.5 MG PO TABS: 2.5000 mg | ORAL_TABLET | Freq: Two times a day (BID) | ORAL | Status: DC

## 2015-02-04 NOTE — Progress Notes (Signed)
Made the decision, with patient and daughter to switch to Eliquis today.  INR today in office was 4.2 and patient refuses to come more often than every 6 weeks.  Explained the dangers of not checking INR sooner when that high, but patient was adamant.  She was unsure of switching medications, worried about cost, but I explained that she either had to come more frequently or make the switch.  At her age, coming to the office is becoming more difficult and wears her down.  Gave her samples to last 4 weeks (5 mg tabs that daughter will cut in 1/2) as well as 30 day free trial.  Do not have any recent BMET on her, but based on age and body weight she would only need 2.5 mg bid.  Explained risks with this same as with warfarin and stressed that she is not to start until Sunday am, due to elevated INR today.  Daughter voiced understanding, as did patient.

## 2015-03-25 ENCOUNTER — Ambulatory Visit (INDEPENDENT_AMBULATORY_CARE_PROVIDER_SITE_OTHER): Payer: Medicare Other | Admitting: Pharmacist Clinician (PhC)/ Clinical Pharmacy Specialist

## 2015-03-25 ENCOUNTER — Other Ambulatory Visit: Payer: Self-pay | Admitting: Cardiology

## 2015-03-25 DIAGNOSIS — Z7901 Long term (current) use of anticoagulants: Secondary | ICD-10-CM | POA: Diagnosis not present

## 2015-03-25 DIAGNOSIS — I481 Persistent atrial fibrillation: Secondary | ICD-10-CM | POA: Diagnosis not present

## 2015-03-25 DIAGNOSIS — I4891 Unspecified atrial fibrillation: Secondary | ICD-10-CM

## 2015-03-25 DIAGNOSIS — I4819 Other persistent atrial fibrillation: Secondary | ICD-10-CM

## 2015-03-25 LAB — POCT INR: INR: 3.3

## 2015-04-23 ENCOUNTER — Ambulatory Visit: Payer: Medicare Other | Admitting: Family

## 2015-04-28 ENCOUNTER — Ambulatory Visit: Payer: Medicare Other | Admitting: Family

## 2015-05-06 ENCOUNTER — Ambulatory Visit (INDEPENDENT_AMBULATORY_CARE_PROVIDER_SITE_OTHER): Payer: Medicare Other | Admitting: Pharmacist Clinician (PhC)/ Clinical Pharmacy Specialist

## 2015-05-06 DIAGNOSIS — I4819 Other persistent atrial fibrillation: Secondary | ICD-10-CM

## 2015-05-06 DIAGNOSIS — Z7901 Long term (current) use of anticoagulants: Secondary | ICD-10-CM

## 2015-05-06 DIAGNOSIS — I4891 Unspecified atrial fibrillation: Secondary | ICD-10-CM

## 2015-05-06 DIAGNOSIS — I481 Persistent atrial fibrillation: Secondary | ICD-10-CM | POA: Diagnosis not present

## 2015-05-06 LAB — POCT INR: INR: 2

## 2015-06-10 ENCOUNTER — Ambulatory Visit (INDEPENDENT_AMBULATORY_CARE_PROVIDER_SITE_OTHER): Payer: Medicare Other | Admitting: Pharmacist Clinician (PhC)/ Clinical Pharmacy Specialist

## 2015-06-10 DIAGNOSIS — I4819 Other persistent atrial fibrillation: Secondary | ICD-10-CM

## 2015-06-10 DIAGNOSIS — Z7901 Long term (current) use of anticoagulants: Secondary | ICD-10-CM

## 2015-06-10 DIAGNOSIS — I481 Persistent atrial fibrillation: Secondary | ICD-10-CM

## 2015-06-10 DIAGNOSIS — I4891 Unspecified atrial fibrillation: Secondary | ICD-10-CM | POA: Diagnosis not present

## 2015-06-10 LAB — POCT INR: INR: 2.7

## 2015-06-17 ENCOUNTER — Encounter: Payer: Medicare Other | Admitting: Pharmacist Clinician (PhC)/ Clinical Pharmacy Specialist

## 2015-07-22 ENCOUNTER — Ambulatory Visit (INDEPENDENT_AMBULATORY_CARE_PROVIDER_SITE_OTHER): Payer: Medicare Other | Admitting: Pharmacist Clinician (PhC)/ Clinical Pharmacy Specialist

## 2015-07-22 DIAGNOSIS — Z7901 Long term (current) use of anticoagulants: Secondary | ICD-10-CM | POA: Diagnosis not present

## 2015-07-22 DIAGNOSIS — I4891 Unspecified atrial fibrillation: Secondary | ICD-10-CM | POA: Diagnosis not present

## 2015-07-22 DIAGNOSIS — I481 Persistent atrial fibrillation: Secondary | ICD-10-CM

## 2015-07-22 DIAGNOSIS — I4819 Other persistent atrial fibrillation: Secondary | ICD-10-CM

## 2015-07-22 LAB — POCT INR: INR: 3

## 2015-09-02 ENCOUNTER — Ambulatory Visit (INDEPENDENT_AMBULATORY_CARE_PROVIDER_SITE_OTHER): Payer: Medicare Other | Admitting: Pharmacist Clinician (PhC)/ Clinical Pharmacy Specialist

## 2015-09-02 DIAGNOSIS — I4819 Other persistent atrial fibrillation: Secondary | ICD-10-CM

## 2015-09-02 DIAGNOSIS — I481 Persistent atrial fibrillation: Secondary | ICD-10-CM

## 2015-09-02 DIAGNOSIS — Z7901 Long term (current) use of anticoagulants: Secondary | ICD-10-CM

## 2015-09-02 DIAGNOSIS — I4891 Unspecified atrial fibrillation: Secondary | ICD-10-CM

## 2015-09-02 LAB — POCT INR: INR: 4.3

## 2015-10-07 ENCOUNTER — Ambulatory Visit (INDEPENDENT_AMBULATORY_CARE_PROVIDER_SITE_OTHER): Payer: Medicare Other | Admitting: Pharmacist

## 2015-10-07 DIAGNOSIS — I4891 Unspecified atrial fibrillation: Secondary | ICD-10-CM

## 2015-10-07 DIAGNOSIS — I481 Persistent atrial fibrillation: Secondary | ICD-10-CM | POA: Diagnosis not present

## 2015-10-07 DIAGNOSIS — I4819 Other persistent atrial fibrillation: Secondary | ICD-10-CM

## 2015-10-07 DIAGNOSIS — Z7901 Long term (current) use of anticoagulants: Secondary | ICD-10-CM

## 2015-10-07 LAB — POCT INR: INR: 2.8

## 2015-10-14 ENCOUNTER — Encounter: Payer: Medicare Other | Admitting: Pharmacist Clinician (PhC)/ Clinical Pharmacy Specialist

## 2015-11-18 ENCOUNTER — Ambulatory Visit (INDEPENDENT_AMBULATORY_CARE_PROVIDER_SITE_OTHER): Payer: Medicare Other | Admitting: Pharmacist Clinician (PhC)/ Clinical Pharmacy Specialist

## 2015-11-18 DIAGNOSIS — I481 Persistent atrial fibrillation: Secondary | ICD-10-CM

## 2015-11-18 DIAGNOSIS — Z7901 Long term (current) use of anticoagulants: Secondary | ICD-10-CM

## 2015-11-18 DIAGNOSIS — I4891 Unspecified atrial fibrillation: Secondary | ICD-10-CM | POA: Diagnosis not present

## 2015-11-18 DIAGNOSIS — I4819 Other persistent atrial fibrillation: Secondary | ICD-10-CM

## 2015-11-18 LAB — POCT INR: INR: 3

## 2015-11-24 ENCOUNTER — Other Ambulatory Visit: Payer: Self-pay | Admitting: Cardiology

## 2015-11-24 NOTE — Telephone Encounter (Signed)
Routed Rx Refill

## 2015-12-30 ENCOUNTER — Ambulatory Visit (INDEPENDENT_AMBULATORY_CARE_PROVIDER_SITE_OTHER): Payer: Medicare Other | Admitting: Pharmacist Clinician (PhC)/ Clinical Pharmacy Specialist

## 2015-12-30 DIAGNOSIS — I4891 Unspecified atrial fibrillation: Secondary | ICD-10-CM

## 2015-12-30 DIAGNOSIS — Z7901 Long term (current) use of anticoagulants: Secondary | ICD-10-CM | POA: Diagnosis not present

## 2015-12-30 LAB — POCT INR: INR: 2.8

## 2016-01-04 ENCOUNTER — Other Ambulatory Visit: Payer: Self-pay | Admitting: Cardiology

## 2016-02-10 ENCOUNTER — Ambulatory Visit (INDEPENDENT_AMBULATORY_CARE_PROVIDER_SITE_OTHER): Payer: Medicare Other | Admitting: Pharmacist

## 2016-02-10 DIAGNOSIS — I4891 Unspecified atrial fibrillation: Secondary | ICD-10-CM

## 2016-02-10 DIAGNOSIS — Z7901 Long term (current) use of anticoagulants: Secondary | ICD-10-CM

## 2016-02-10 LAB — POCT INR: INR: 3.1

## 2016-03-23 ENCOUNTER — Ambulatory Visit (INDEPENDENT_AMBULATORY_CARE_PROVIDER_SITE_OTHER): Payer: Medicare Other | Admitting: Pharmacist Clinician (PhC)/ Clinical Pharmacy Specialist

## 2016-03-23 DIAGNOSIS — Z7901 Long term (current) use of anticoagulants: Secondary | ICD-10-CM | POA: Diagnosis not present

## 2016-03-23 DIAGNOSIS — I4891 Unspecified atrial fibrillation: Secondary | ICD-10-CM | POA: Diagnosis not present

## 2016-03-23 LAB — POCT INR: INR: 2.2

## 2016-04-04 ENCOUNTER — Encounter (HOSPITAL_COMMUNITY): Payer: Self-pay | Admitting: Emergency Medicine

## 2016-04-04 ENCOUNTER — Telehealth: Payer: Self-pay | Admitting: Cardiology

## 2016-04-04 ENCOUNTER — Emergency Department (HOSPITAL_COMMUNITY)
Admission: EM | Admit: 2016-04-04 | Discharge: 2016-04-04 | Disposition: A | Discharge status: Home / Self Care | Payer: Medicare Other | Attending: Emergency Medicine | Admitting: Emergency Medicine

## 2016-04-04 ENCOUNTER — Emergency Department (HOSPITAL_COMMUNITY): Payer: Medicare Other

## 2016-04-04 DIAGNOSIS — I110 Hypertensive heart disease with heart failure: Secondary | ICD-10-CM | POA: Insufficient documentation

## 2016-04-04 DIAGNOSIS — Z7901 Long term (current) use of anticoagulants: Secondary | ICD-10-CM | POA: Diagnosis not present

## 2016-04-04 DIAGNOSIS — R42 Dizziness and giddiness: Secondary | ICD-10-CM | POA: Diagnosis present

## 2016-04-04 DIAGNOSIS — Z79899 Other long term (current) drug therapy: Secondary | ICD-10-CM | POA: Insufficient documentation

## 2016-04-04 DIAGNOSIS — I5030 Unspecified diastolic (congestive) heart failure: Secondary | ICD-10-CM | POA: Diagnosis not present

## 2016-04-04 DIAGNOSIS — N3000 Acute cystitis without hematuria: Secondary | ICD-10-CM | POA: Diagnosis not present

## 2016-04-04 DIAGNOSIS — Z96649 Presence of unspecified artificial hip joint: Secondary | ICD-10-CM | POA: Insufficient documentation

## 2016-04-04 LAB — URINALYSIS, ROUTINE W REFLEX MICROSCOPIC
Bilirubin Urine: NEGATIVE
Glucose, UA: NEGATIVE mg/dL
Ketones, ur: NEGATIVE mg/dL
LEUKOCYTES UA: NEGATIVE
NITRITE: POSITIVE — AB
PROTEIN: NEGATIVE mg/dL
Specific Gravity, Urine: 1.008 (ref 1.005–1.030)
pH: 7 (ref 5.0–8.0)

## 2016-04-04 LAB — COMPREHENSIVE METABOLIC PANEL
ALK PHOS: 62 U/L (ref 38–126)
ALT: 12 U/L — ABNORMAL LOW (ref 14–54)
ANION GAP: 7 (ref 5–15)
AST: 20 U/L (ref 15–41)
Albumin: 3.9 g/dL (ref 3.5–5.0)
BILIRUBIN TOTAL: 1.1 mg/dL (ref 0.3–1.2)
BUN: 21 mg/dL — ABNORMAL HIGH (ref 6–20)
CALCIUM: 9.4 mg/dL (ref 8.9–10.3)
CO2: 23 mmol/L (ref 22–32)
Chloride: 110 mmol/L (ref 101–111)
Creatinine, Ser: 0.8 mg/dL (ref 0.44–1.00)
GFR calc non Af Amer: >60 mL/min (ref >60)
Glucose, Bld: 81 mg/dL (ref 65–99)
POTASSIUM: 4 mmol/L (ref 3.5–5.1)
Sodium: 140 mmol/L (ref 135–145)
TOTAL PROTEIN: 6.7 g/dL (ref 6.5–8.1)

## 2016-04-04 LAB — CBC WITH DIFFERENTIAL/PLATELET
BASOS ABS: 0 10*3/uL (ref 0.0–0.1)
BASOS PCT: 0 %
Eosinophils Absolute: 0.1 10*3/uL (ref 0.0–0.7)
Eosinophils Relative: 2 %
HEMATOCRIT: 39.6 % (ref 36.0–46.0)
HEMOGLOBIN: 12.7 g/dL (ref 12.0–15.0)
LYMPHS PCT: 20 %
Lymphs Abs: 1 10*3/uL (ref 0.7–4.0)
MCH: 30.5 pg (ref 26.0–34.0)
MCHC: 32.1 g/dL (ref 30.0–36.0)
MCV: 95 fL (ref 78.0–100.0)
Monocytes Absolute: 0.6 10*3/uL (ref 0.1–1.0)
Monocytes Relative: 11 %
NEUTROS ABS: 3.5 10*3/uL (ref 1.7–7.7)
NEUTROS PCT: 67 %
Platelets: 144 10*3/uL — ABNORMAL LOW (ref 150–400)
RBC: 4.17 MIL/uL (ref 3.87–5.11)
RDW: 14.4 % (ref 11.5–15.5)
WBC: 5.3 10*3/uL (ref 4.0–10.5)

## 2016-04-04 LAB — PROTIME-INR
INR: 2.66
Prothrombin Time: 28.9 seconds — ABNORMAL HIGH (ref 11.4–15.2)

## 2016-04-04 LAB — URINE MICROSCOPIC-ADD ON

## 2016-04-04 MED ORDER — CEPHALEXIN 500 MG PO CAPS: 500.0000 mg | ORAL_CAPSULE | Freq: Three times a day (TID) | ORAL | 0 refills | Status: AC

## 2016-04-04 MED ORDER — SODIUM CHLORIDE 0.9 % IV BOLUS (SEPSIS)
500.0000 mL | Freq: Once | INTRAVENOUS | Status: AC
  Administered 2016-04-04: 500 mL via INTRAVENOUS

## 2016-04-04 NOTE — Telephone Encounter (Signed)
Pt of Dr. Herbie BaltimoreHarding  Received call from daughter of patient. She notes EMS at house to evaluate patient after being found weak and poorly responsive by cleaning lady. Pt verbalized she had not eaten in several days.  I [facility].  He notes findings including HR of 70, (noted to be slightly irregular)  BP of 170/100.  RR 18-20 WOB normal He was in process of performing 12 lead EKG. Notes overall patient mentally at baseline  Discussed medical history as noted on chart. She has not been seen recently by cardiology and may have been more recently evaluated by PCP. He will follow up on this. Thayer Ohmhris and I discussed concerns which family had - noted patient has not eaten and either drank very little or no fluids at all for past 3 days.  He notes family agreeable to have EMS transport to ED. I noted we would agree w appropriateness of ED evaluation. Sounds like she will need general workup, uncertain if cardiology will need to assess. No further concerns at this time. Routed to her cardiologist as Lorain ChildesFYI.

## 2016-04-04 NOTE — ED Notes (Signed)
Patient transported to CT 

## 2016-04-04 NOTE — ED Notes (Signed)
Patient given water

## 2016-04-04 NOTE — ED Notes (Signed)
Pt given graham crackers.

## 2016-04-04 NOTE — ED Notes (Signed)
Attempted in and out cath x2 without success RN made aware

## 2016-04-04 NOTE — ED Notes (Signed)
Attempt in and out unsuccessful; met resistance; did not attempt to continue related to resistance met. Frederico Hamman primary nurse made aware.

## 2016-04-04 NOTE — ED Notes (Signed)
Attempted for pt to try to use bedpan before attempting I&O cath

## 2016-04-04 NOTE — ED Notes (Signed)
Bed: WA04 Expected date:  Expected time:  Means of arrival:  Comments: EMS/FTT 

## 2016-04-04 NOTE — ED Notes (Signed)
ED Provider at bedside. 

## 2016-04-04 NOTE — ED Provider Notes (Signed)
[facility]-EMERGENCY DEPT Provider Note   CSN: [id number] Arrival date & time: 04/04/16  1228     History   Chief Complaint Chief Complaint  Patient presents with  . Dizziness    HPI Lindsey Ferguson is a 82 y.o. female.  Lindsey Ferguson is a 82 y.o. Female who presents to the ED with her daughter who reports that they think she is dehydrated today. The patient usually lives alone at home and does well. They report her cleaning lady came by the house today and thought she was dehydrated and so they brought her to the ED for evaluation. They report she has been acting appropriately and at her baseline when they have been with her. She tells me she has not been eating as much over the past few days, but has still been eating and drinking. She tells me she stood up today getting out of her bed and felt dizzy, then sat down and felt better. She denies feeling dizzy now. She denies falling. She tells me she does not know why she is here and would like to go home. She denies other complaints. She denies fevers, cough, chest pain, shortness of breath, rashes, headache, falls, abdominal pain, nausea, vomiting, diarrhea or urinary symptoms.   The history is provided by the patient and a relative. No language interpreter was used.  Dizziness  Associated symptoms: no chest pain, no diarrhea, no headaches, no nausea, no shortness of breath, no vomiting and no weakness     Past Medical History:  Diagnosis Date  . Arthritis   . Atrial fibrillation (HCC)   . Diastolic heart failure, NYHA class 2 (HCC)    Noted on echo  . Esophageal dysphagia     with a tortuous esophagus  . Hypertension   . Pyloric stricture February 2014   Status post dilation in February 2014  . Rectal prolapse     Patient Active Problem List   Diagnosis Date Noted  . Long term (current) use of anticoagulants 03/25/2015  . Essential hypertension -- allowing for mild permissive hypertension 11/24/2012    Class: Diagnosis of  .  Gastric outlet obstruction 05/23/2012  . Pyloric stenosis 05/22/2012  . Dysphagia 05/21/2012  . Hiatal hernia with obstruction but no gangrene 05/21/2012  . Chronic combined systolic and diastolic CHF (congestive heart failure) (HCC) 05/21/2012  . Moderate to severe pulmonary hypertension 05/21/2012  . Mitral regurgitation 05/21/2012  . Long term current use of anticoagulant therapy 08/18/2010  . COAGULOPATHY, COUMADIN-INDUCED 07/05/2009  . ATRIAL FIBRILLATION WITH CONTROLLED VENTRICULAR RESPONSE 06/07/2009  . CHF 06/07/2009  . RECTAL PROLAPSE 06/07/2009    Past Surgical History:  Procedure Laterality Date  . ARTHROSCOPIC REPAIR PCL     x2  . ESOPHAGOGASTRODUODENOSCOPY  05/22/2012   Procedure: ESOPHAGOGASTRODUODENOSCOPY (EGD);  Surgeon: Theda Belfast, MD;  Location: [facility];  Service: Endoscopy;  Laterality: N/A;  Rm 1512  . hernia repair with mesh  2011  . TOTAL HIP ARTHROPLASTY      OB History    No data available       Home Medications    Prior to Admission medications   Medication Sig Start Date End Date Taking? Authorizing Provider  carvedilol (COREG) 6.25 MG tablet TAKE 1 TABLET TWICE A DAY WITH A MEAL 01/04/16  Yes Marykay Lex, MD  Multiple Vitamin (MULTIVITAMIN) tablet Take 1 tablet by mouth daily.     Yes Historical Provider, MD  trolamine salicylate (ASPERCREME) 10 % cream Apply 1 application  topically as needed for muscle pain.   Yes Historical Provider, MD  warfarin (COUMADIN) 2.5 MG tablet Take 1.25-2.5 mg by mouth See admin instructions. Take one tablet on Monday, Wednesday, Friday and half a tablet the rest of the week.   Yes Historical Provider, MD  cephALEXin (KEFLEX) 500 MG capsule Take 1 capsule (500 mg total) by mouth 3 (three) times daily. 04/04/16   Everlene FarrierWilliam Keishawna Carranza, PA-C    Family History Family History  Problem Relation Age of Onset  . Stroke Father   . Brain cancer Mother   . Cancer Brother     Social History Social History  Substance  Use Topics  . Smoking status: Never Smoker  . Smokeless tobacco: Never Used  . Alcohol use No     Allergies   Patient has no known allergies.   Review of Systems Review of Systems  Constitutional: Negative for chills and fever.  HENT: Negative for congestion and sore throat.   Eyes: Negative for visual disturbance.  Respiratory: Negative for cough and shortness of breath.   Cardiovascular: Negative for chest pain.  Gastrointestinal: Negative for abdominal pain, diarrhea, nausea and vomiting.  Genitourinary: Negative for difficulty urinating and dysuria.  Musculoskeletal: Negative for back pain and neck pain.  Skin: Negative for rash.  Neurological: Positive for dizziness. Negative for syncope, weakness, light-headedness, numbness and headaches.     Physical Exam Updated Vital Signs BP (!) 171/110 (BP Location: Left Arm)   Pulse 73   Temp 97.4 F (36.3 C) (Axillary)   Resp 22   SpO2 99%   Physical Exam  Constitutional: She is oriented to person, place, and time. She appears well-developed and well-nourished. No distress.  HENT:  Head: Normocephalic and atraumatic.  Mouth/Throat: Oropharynx is clear and moist.  Mucous membranes are moist. No visible signs of head trauma.  Eyes: Conjunctivae and EOM are normal. Pupils are equal, round, and reactive to light. Right eye exhibits no discharge. Left eye exhibits no discharge.  Neck: Neck supple.  Cardiovascular: Normal rate, regular rhythm, normal heart sounds and intact distal pulses.   Pulmonary/Chest: Effort normal and breath sounds normal. No respiratory distress. She has no wheezes. She has no rales.  Lungs are clear to auscultation bilaterally.  Abdominal: Soft. Bowel sounds are normal. There is no tenderness. There is no guarding.  Abdomen is soft and nontender to palpation.  Musculoskeletal: Normal range of motion. She exhibits no edema or tenderness.  No lower extremity edema or tenderness. Good strength her  bilateral upper and lower extremities. No midline neck or back tenderness.  Lymphadenopathy:    She has no cervical adenopathy.  Neurological: She is alert and oriented to person, place, and time. No cranial nerve deficit. Coordination normal.  Patient is alert and oriented 3, but does have some difficulty at times remembering specific details about her medical history.  Cranial nerves are intact. Speech is clear and coherent. Good strength in her bilateral upper and lower extremities. Good grip strengths bilaterally.  Skin: Skin is warm and dry. Capillary refill takes less than 2 seconds. No rash noted. She is not diaphoretic. No erythema. No pallor.  Psychiatric: She has a normal mood and affect. Her behavior is normal.  Nursing note and vitals reviewed.    ED Treatments / Results  Labs (all labs ordered are listed, but only abnormal results are displayed) Labs Reviewed  COMPREHENSIVE METABOLIC PANEL - Abnormal; Notable for the following:       Result Value   BUN  21 (*)    ALT 12 (*)    All other components within normal limits  CBC WITH DIFFERENTIAL/PLATELET - Abnormal; Notable for the following:    Platelets 144 (*)    All other components within normal limits  PROTIME-INR - Abnormal; Notable for the following:    Prothrombin Time 28.9 (*)    All other components within normal limits  URINALYSIS, ROUTINE W REFLEX MICROSCOPIC (NOT AT [facility]) - Abnormal; Notable for the following:    Hgb urine dipstick SMALL (*)    Nitrite POSITIVE (*)    All other components within normal limits  URINE MICROSCOPIC-ADD ON - Abnormal; Notable for the following:    Squamous Epithelial / LPF 0-5 (*)    Bacteria, UA MANY (*)    All other components within normal limits  URINE CULTURE    EKG  EKG Interpretation  Date/Time:  Tuesday April 04 2016 12:44:16 EST Ventricular Rate:  71 PR Interval:    QRS Duration: 111 QT Interval:  428 QTC Calculation: 466 R Axis:   108 Text Interpretation:   Atrial fibrillation Lateral infarct, old Probable anteroseptal infarct, old Baseline wander in lead(s) V4 No significant change since last tracing Confirmed by ISAACS MD, CAMERON [phone]([id number]) on 04/04/2016 4:49:39 PM       Radiology Ct Head Wo Contrast  Result Date: 04/04/2016 CLINICAL DATA:  Dizziness, inability to eat or drink for 3 EXAM: CT HEAD WITHOUT CONTRAST TECHNIQUE: Contiguous axial images were obtained from the base of the skull through the vertex without intravenous contrast. COMPARISON:  10/02/2009 FINDINGS: Brain: No evidence of acute infarction, hemorrhage, extra-axial collection, ventriculomegaly, or mass effect. Generalized cerebral atrophy. Periventricular white matter low attenuation likely secondary to microangiopathy. Vascular: Cerebrovascular atherosclerotic calcifications are noted. Skull: Negative for fracture or focal lesion. Sinuses/Orbits: Visualized portions of the orbits are unremarkable. Visualized portions of the paranasal sinuses and mastoid air cells are unremarkable. Other: None. IMPRESSION: 1. No acute intracranial pathology. 2. Chronic microvascular disease and cerebral atrophy. Electronically Signed   By: Elige KoHetal  Patel   On: 04/04/2016 14:09    Procedures Procedures (including critical care time)  Medications Ordered in ED Medications  sodium chloride 0.9 % bolus 500 mL (0 mLs Intravenous Stopped 04/04/16 1607)     Initial Impression / Assessment and Plan / ED Course  I have reviewed the triage vital signs and the nursing notes.  Pertinent labs & imaging results that were available during my care of the patient were reviewed by me and considered in my medical decision making (see chart for details).  Clinical Course    This is a 82 y.o. Female who presents to the ED with her daughter who reports that they think she is dehydrated today. The patient usually lives alone at home and does well. They report her cleaning lady came by the house today and thought she  was dehydrated and so they brought her to the ED for evaluation. They report she has been acting appropriately and at her baseline when they have been with her. She tells me she has not been eating as much over the past few days, but has still been eating and drinking. She tells me she stood up today getting out of her bed and felt dizzy, then sat down and felt better. She denies feeling dizzy now. She denies falling. She tells me she does not know why she is here and would like to go home. She denies other complaints. On exam the patient is afebrile nontoxic  appearing. She has no focal neurological deficits. Her abdomen is soft and nontender to palpation. No visible signs of head trauma. She is alert and oriented 3. She does have some difficulty with specific details related to her medical history but is otherwise alert and oriented. CT head without contrast shows no acute findings. INR is 2.66 and therapeutic. CBC is unremarkable. CMP is unremarkable. Urinalysis shows sign of infection with nitrite positive urine from a catheterized urine. Urine sent for culture. We'll treat with Keflex and have her follow up with primary care. I advised the patient to follow-up with their primary care provider this week. I advised the patient to return to the emergency department with new or worsening symptoms or new concerns. The patient and her family verbalized understanding and agreement with plan.    This patient was discussed with and evaluated by Dr. Erma Heritage who agrees with assessment and plan.   Final Clinical Impressions(s) / ED Diagnoses   Final diagnoses:  Acute cystitis without hematuria    New Prescriptions New Prescriptions   CEPHALEXIN (KEFLEX) 500 MG CAPSULE    Take 1 capsule (500 mg total) by mouth 3 (three) times daily.     Everlene Farrier, PA-C 04/04/16 1655    Shaune Pollack, MD 04/05/16 [phone]

## 2016-04-04 NOTE — ED Triage Notes (Signed)
Per EMS, pt from home, c/o inability to eat or drink x3 days. Baseline per family. Denies pain. A&Ox3.

## 2016-04-04 NOTE — ED Notes (Signed)
Nurse is in the room starting a line collecting blood

## 2016-04-04 NOTE — ED Notes (Signed)
Pt taken off of bedpan without success in obtainging an urine specimen

## 2016-04-05 NOTE — Telephone Encounter (Signed)
I agree with this recommendation & COA.  Needs ER eval.  Bryan Lemmaavid Chabeli Barsamian, MD

## 2016-04-07 LAB — URINE CULTURE: Culture: >=100000 — AB

## 2016-04-08 ENCOUNTER — Telehealth (HOSPITAL_BASED_OUTPATIENT_CLINIC_OR_DEPARTMENT_OTHER): Payer: Self-pay

## 2016-04-08 NOTE — Telephone Encounter (Signed)
Post ED Visit - Positive Culture Follow-up  Culture report reviewed by antimicrobial stewardship pharmacist:  []  Enzo BiNathan Batchelder, Pharm.D. []  Celedonio MiyamotoJeremy Frens, Pharm.D., BCPS []  Garvin FilaMike Maccia, Pharm.D. []  Georgina PillionElizabeth Martin, Pharm.D., BCPS []  Sand RidgeMinh Pham, [address]Ph[address]D., BCPS, AAHIVP []  Estella HuskMichelle Turner, Pharm.D., BCPS, AAHIVP []  Tennis Mustassie Stewart, [address]Pharm[address]D. []  Sherle Poeob Vincent, [address] Revonda StandardAllison Masters Pharm D Positive urine culture Treated with Cephalexin, organism sensitive to the same and no further patient follow-up is required at this time.  Jerry CarasCullom, Madison Direnzo Burnett 04/08/2016, 10:03 AM

## 2016-04-19 ENCOUNTER — Telehealth: Payer: Self-pay | Admitting: Cardiology

## 2016-04-21 NOTE — Telephone Encounter (Signed)
Dr Herbie BaltimoreHARDING spoke to [facility] Dr Herbie BaltimoreHarding, okay to to send death certificate for signature. - will notate day and time of death.

## 2016-04-21 DEATH — deceased

## 2016-04-24 ENCOUNTER — Telehealth: Payer: Self-pay | Admitting: Cardiology

## 2016-04-24 NOTE — Telephone Encounter (Signed)
Death Certificate [facility] for Dr Herbie BaltimoreHarding to sign on 04/21/16.  Death Certificate given to Dr Herbie BaltimoreHarding to review and sign.    12.4.17 Received Signed Death Certificate back from Dr Herbie BaltimoreHarding.  Prepared for Pick Up.  [facility].  Updated EPIC System patient Deceased. lp
